# Patient Record
Sex: Female | Born: 1990 | Race: Black or African American | Hispanic: No | Smoking: Former smoker
Health system: Southern US, Community
[De-identification: ages and names within clinical notes are randomized; demographics above are authoritative.]

## PROBLEM LIST (undated history)

## (undated) DIAGNOSIS — B009 Herpesviral infection, unspecified: Secondary | ICD-10-CM

## (undated) DIAGNOSIS — Z789 Other specified health status: Secondary | ICD-10-CM

## (undated) HISTORY — PX: NO PAST SURGERIES: SHX2092

## (undated) HISTORY — DX: Other specified health status: Z78.9

## (undated) HISTORY — DX: Herpesviral infection, unspecified: B00.9

---
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
## 2013-06-17 IMAGING — US ABDOMEN ULTRASOUND
1 series · 14 of 25 positions shown · non-contrast
Comparison: none

REASON FOR EXAM: abdominal pain
COMMENTS:

PROCEDURE:     US  - US ABDOMEN GENERAL SURVEY  - July 11, 2011  [DATE]
RESULT:     Comparison: CT of the abdomen and pelvis 07/11/2011
TECHNIQUE: Multiple grayscale and color Doppler images were obtained of the
abdomen.

[Series 1: abdomen ultrasound · 0.21mm/px · 14 of 105 slices shown]
[im 1/105]
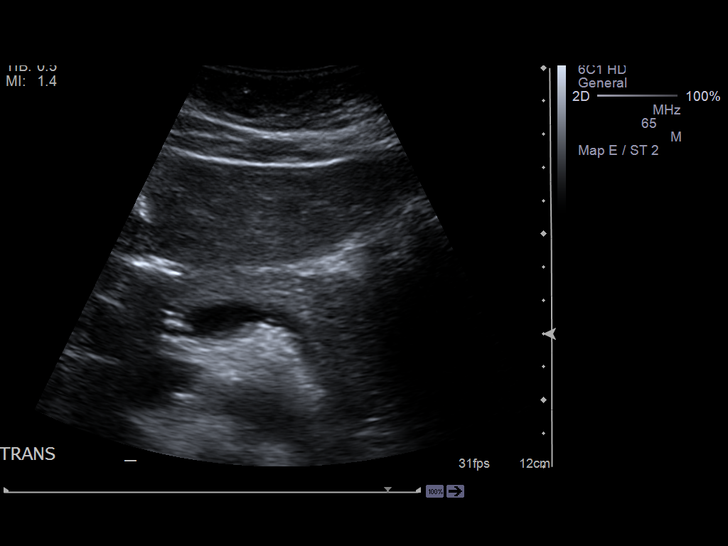
[im 9/105]
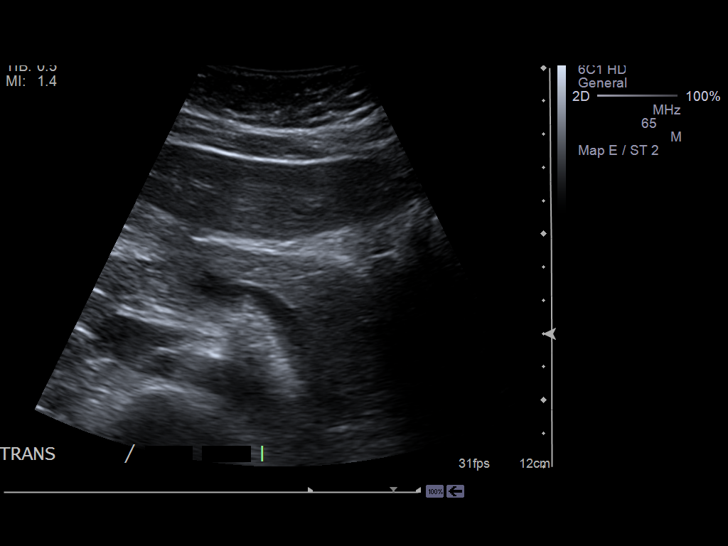
[im 18/105]
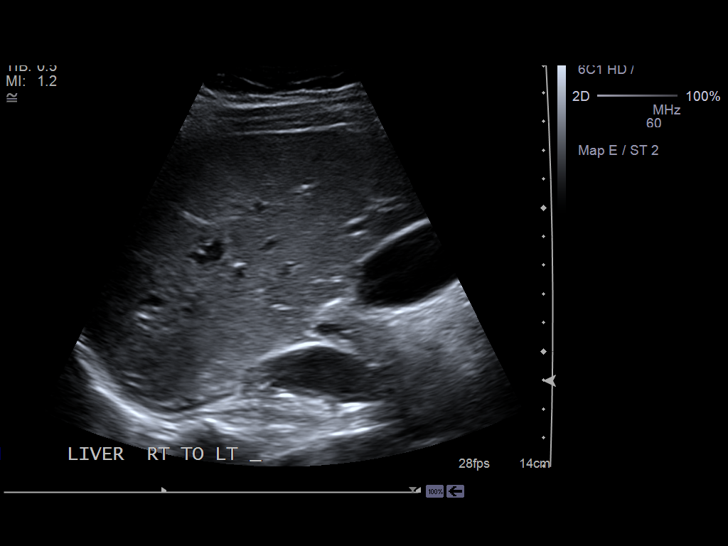
[im 27/105]
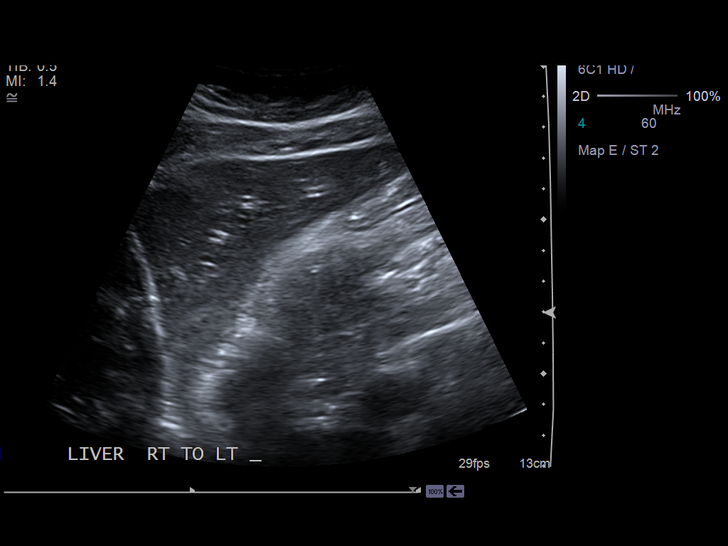
[im 35/105]
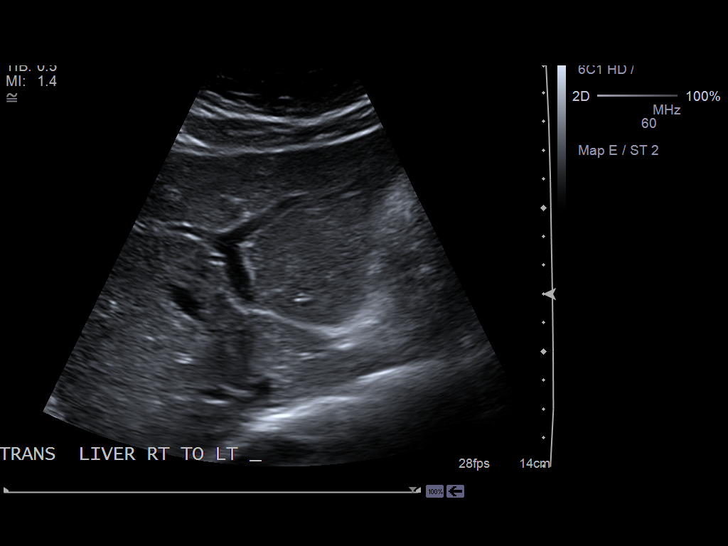
[im 40/105]
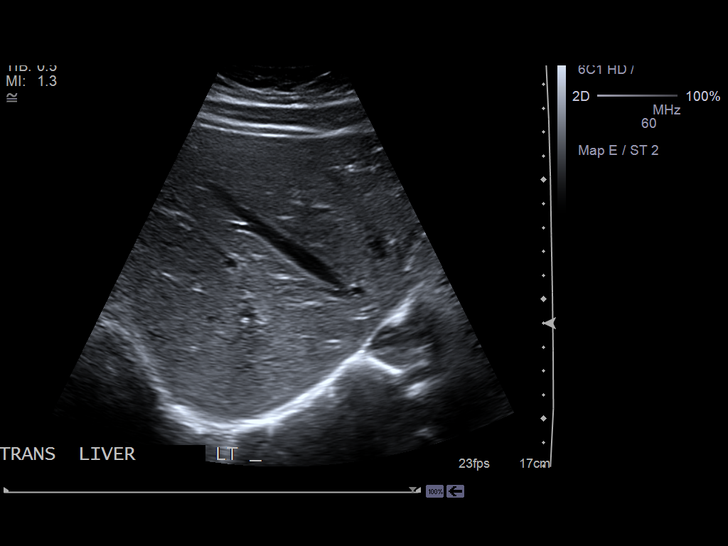
[im 48/105]
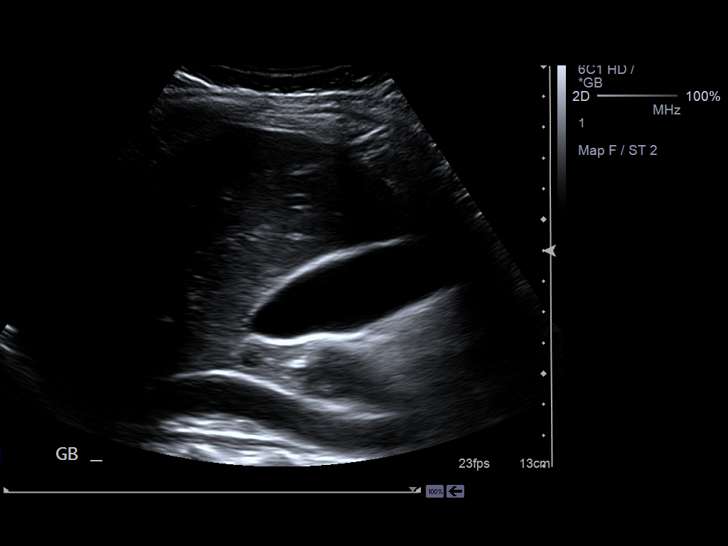
[im 57/105]
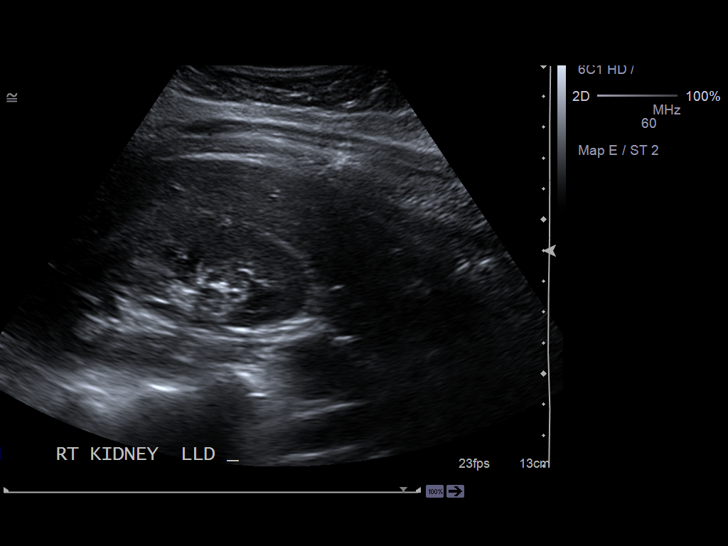
[im 66/105]
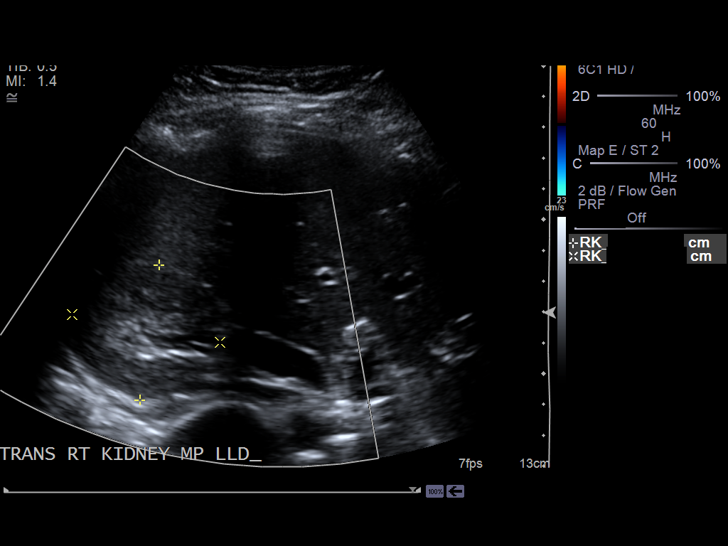
[im 70/105]
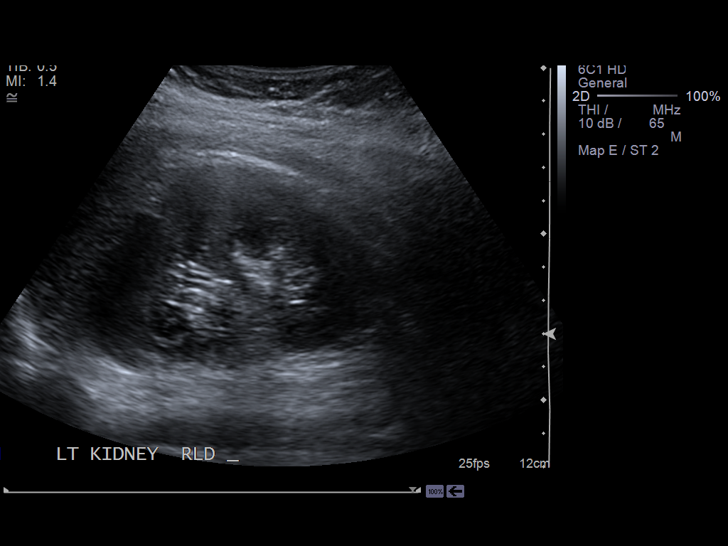
[im 79/105]
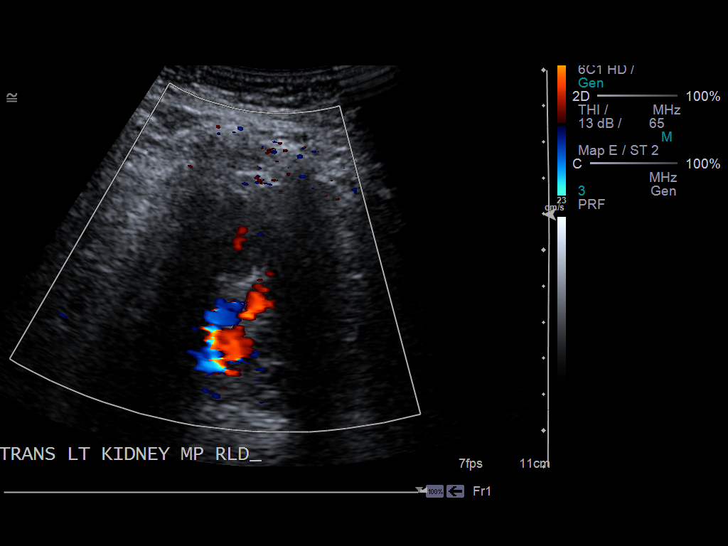
[im 87/105]
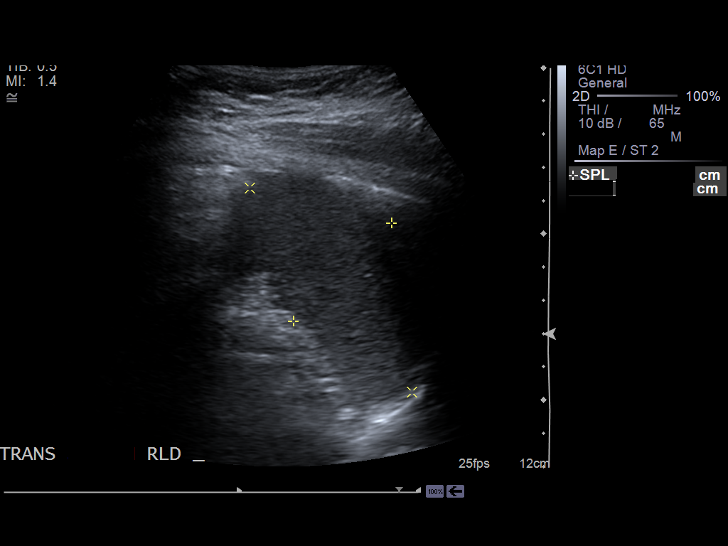
[im 96/105]
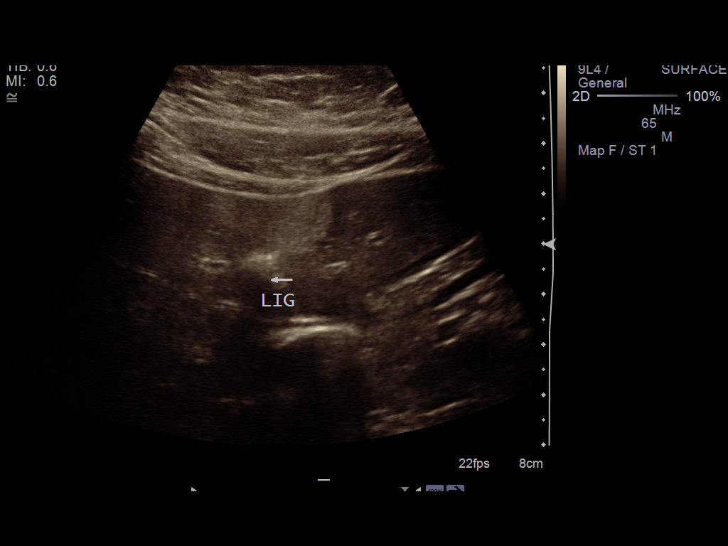
[im 105/105]
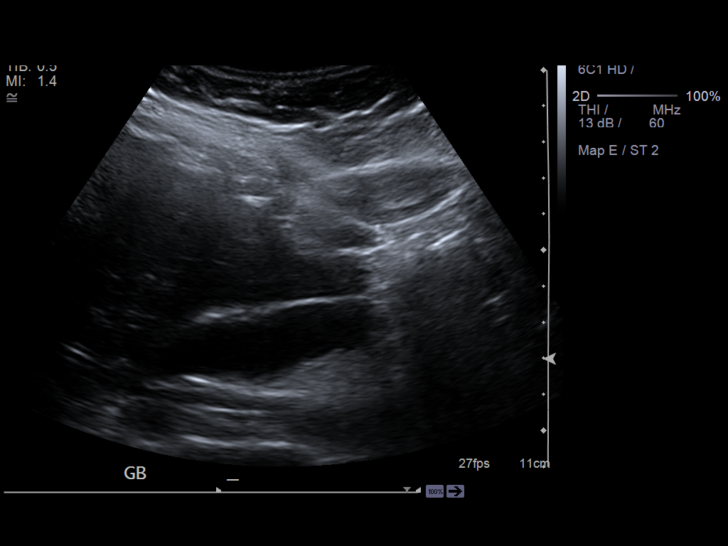

[14 of 25 positions shown; findings below may reference images not displayed]

FINDINGS: The pancreas was partially obscured by overlying bowel gas. The visualized
body of the pancreas is unremarkable. The gallbladder is normal. Sonographic
Murphy sign was negative. The common bile duct measures 2 mm. There is a
small oval shaped area of hyper echogenicity in the liver adjacent to
falciform ligament which likely represents an area of focal fatty deposition
given its appearance and location on this ultrasound as well as the recent
prior CT. This region measures approximately 2.6 x 1.8 x 1.1 cm.

The spleen is unremarkable. Images of the kidneys showed no hydronephrosis.
IMPRESSION: No acute findings. Normal gallbladder.

## 2013-06-17 IMAGING — CT CT ABD-PELV W/ CM
1 of 2 series · 16 of 32 positions shown, 20 images · non-contrast
Comparison: none

REASON FOR EXAM: (1) epigastric/RLQ pain; (2) epigastric/RLQ pain
COMMENTS:

PROCEDURE:     CT  - CT ABDOMEN / PELVIS  W  - July 11, 2011  [DATE]
RESULT:
HISTORY: Epigastric pain.

[Series 2: 3mm soft tissue · axial · 0.64mm/px · z∈[-408,-30]mm · 16 of 138 slices shown, 20 images]
[im 6/138  soft-tissue]
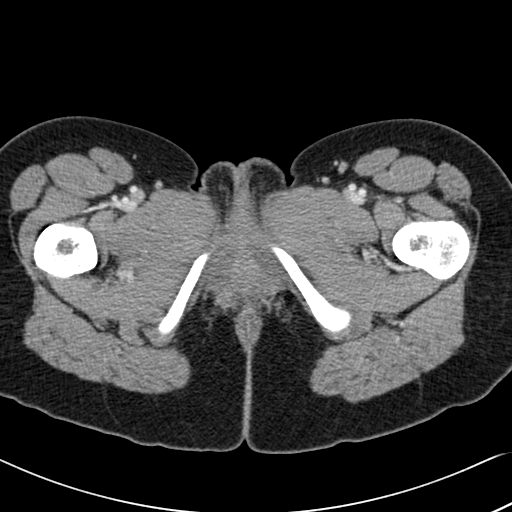
[im 6/138  bone]
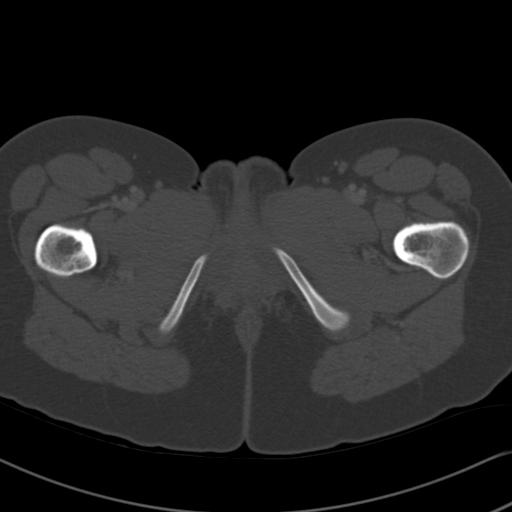
[im 17/138  soft-tissue]
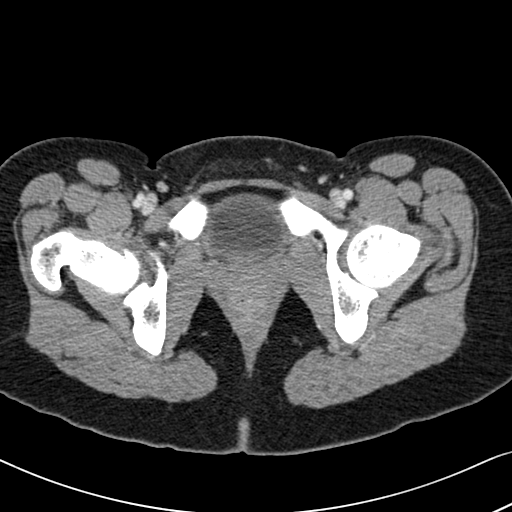
[im 28/138  soft-tissue]
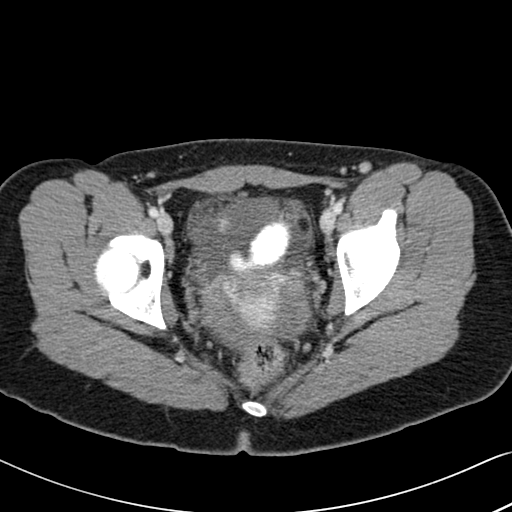
[im 39/138  soft-tissue]
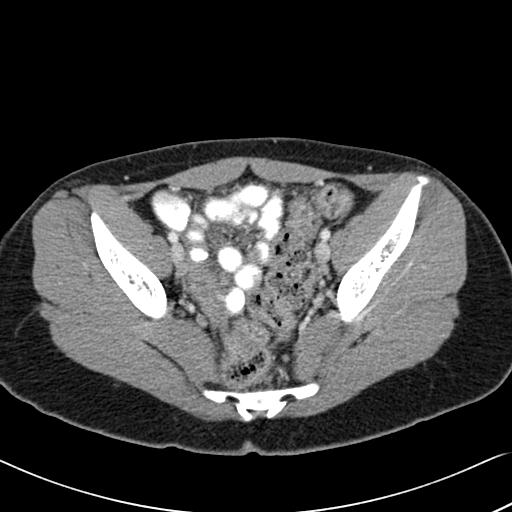
[im 44/138  soft-tissue]
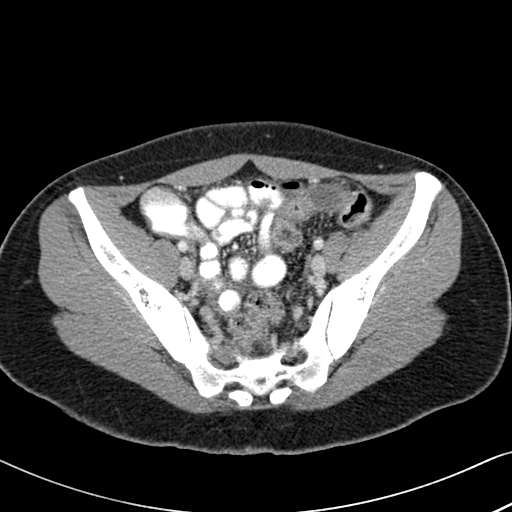
[im 55/138  soft-tissue]
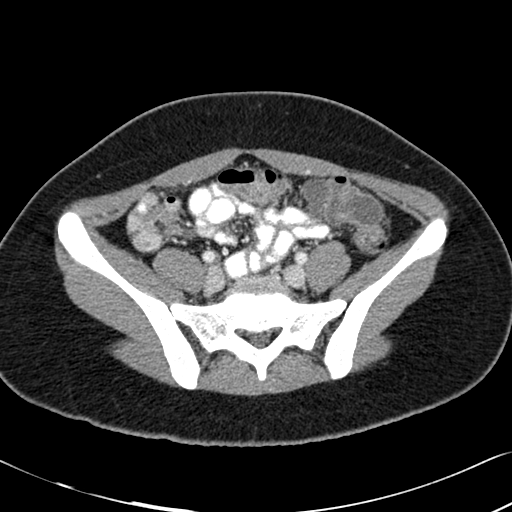
[im 66/138  soft-tissue]
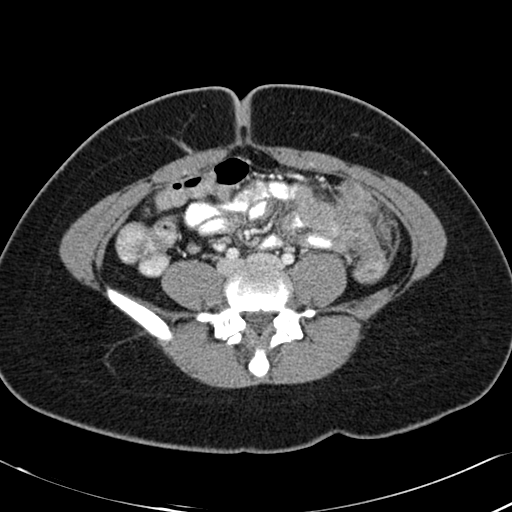
[im 72/138  soft-tissue]
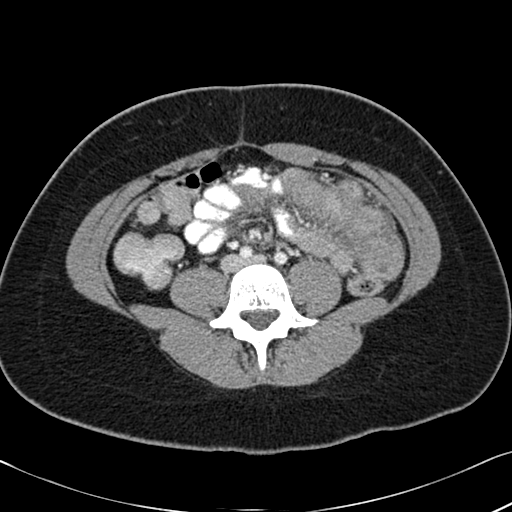
[im 83/138  soft-tissue]
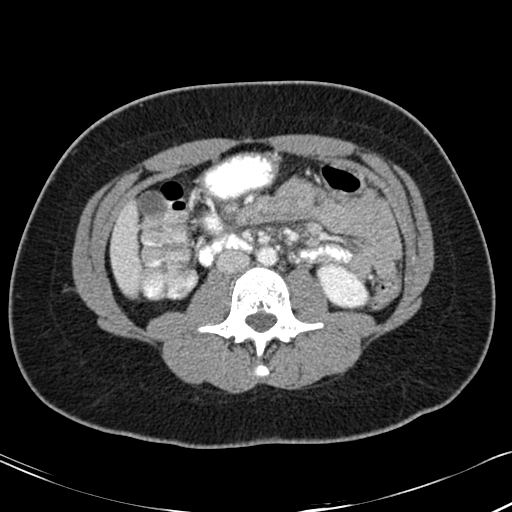
[im 83/138  bone]
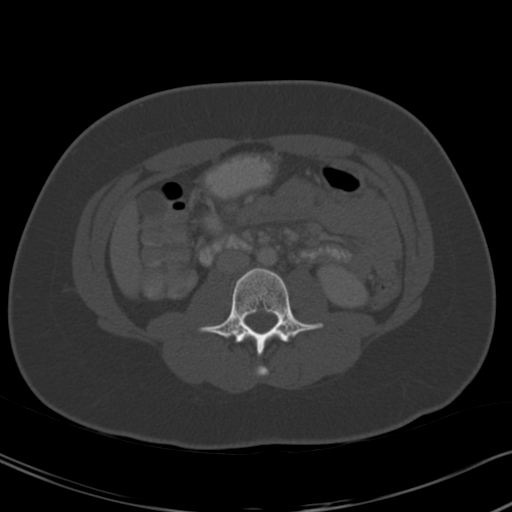
[im 94/138  soft-tissue]
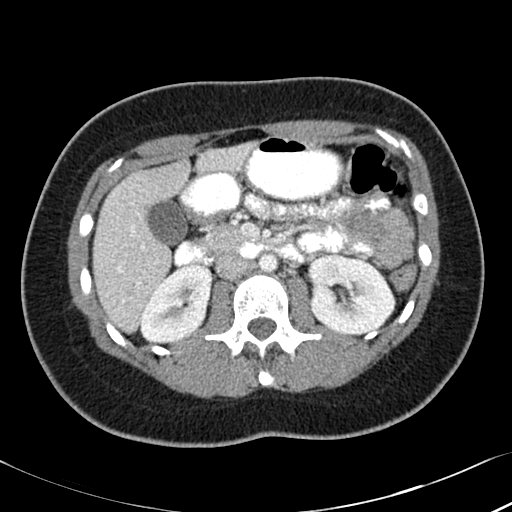
[im 105/138  soft-tissue]
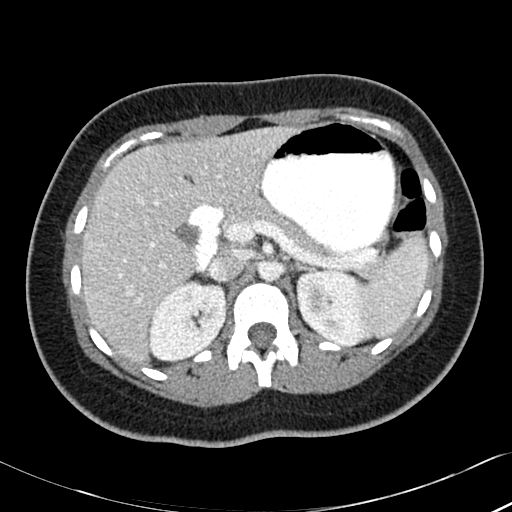
[im 110/138  soft-tissue]
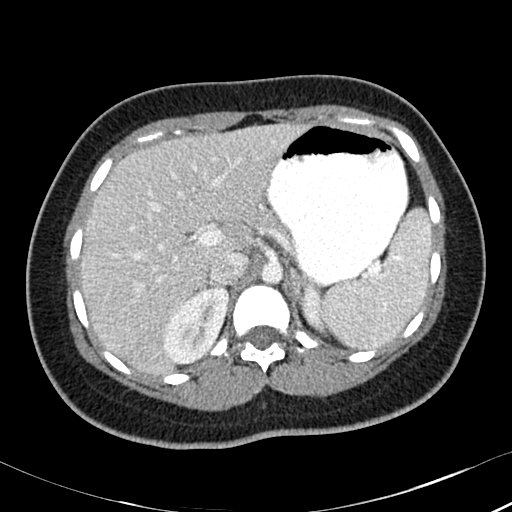
[im 116/138  lung]
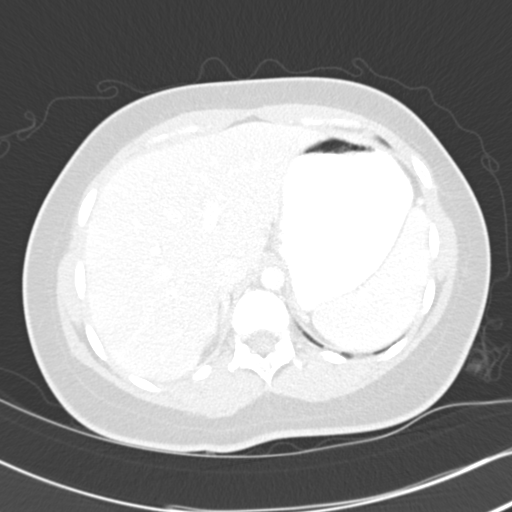
[im 121/138  soft-tissue]
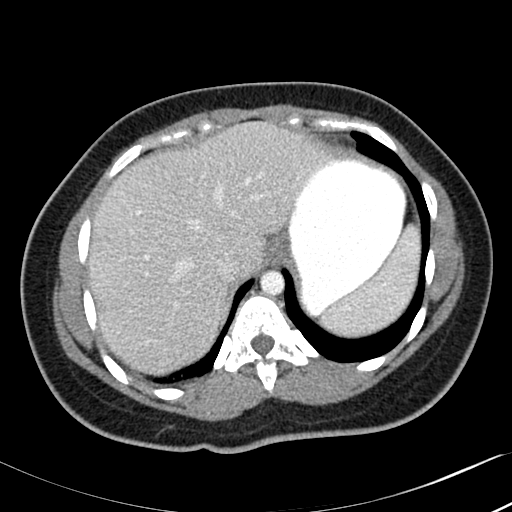
[im 121/138  lung]
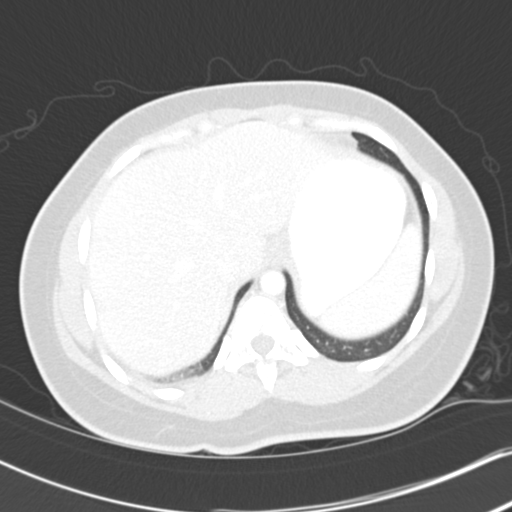
[im 127/138  lung]
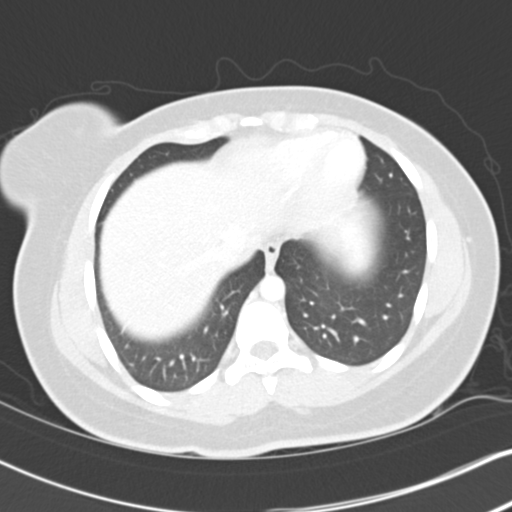
[im 132/138  soft-tissue]
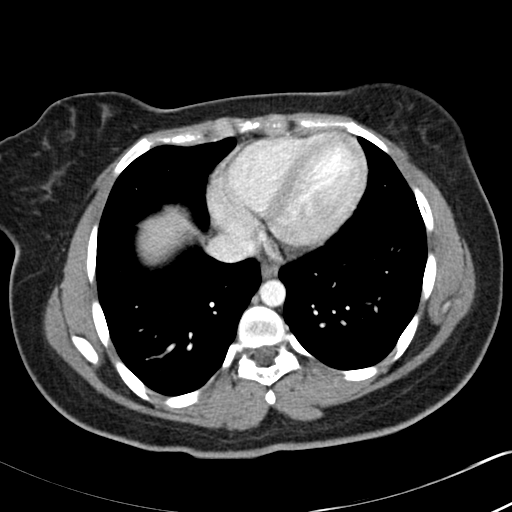
[im 132/138  lung]
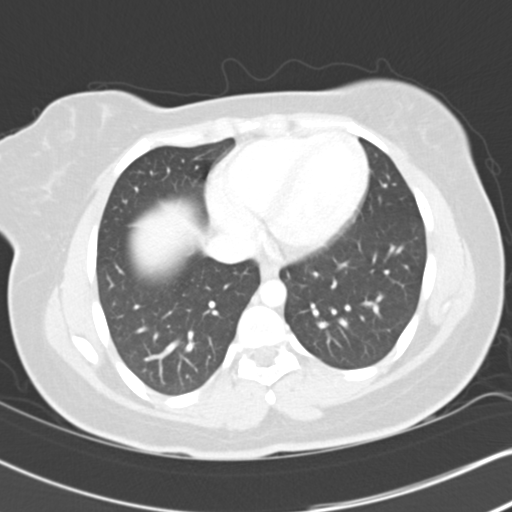

[16 of 32 positions shown; findings below may reference images not displayed]

Comparison Study: No prior.

PROCEDURE AND FINDINGS:   Standard CT obtained with 100 ml of [id number].
Lucency is noted in the region of the falciform ligament and most likely a
focal area of fatty infiltration. MR of the liver may prove useful for
further evaluation. Pancreas, spleen, adrenals, and kidneys are normal. The
bladder is nondistended. There is no bowel distention. Aorta is
nondistended. Mild bilateral adnexal fullness is noted. There is a small
amount of fluid in the cul-de-sac. Appendix is not definitely identified.
IMPRESSION: Bilateral adnexal/ovarian prominence with a small amount of
free pelvic fluid. Pelvic Ultrasound may prove useful for further evaluation.

## 2014-01-14 NOTE — L&D Delivery Note (Signed)
Patient is 25 y.o. G1P0000 [redacted]w[redacted]d admitted for PROM @ 1500 on 9/14. Patient had some elevated BPs yesterday but preeclampsia labs were negative. Patient was given cytotec x 1 and foley bulb was place. Foley bulb fell out about midnight and she had started contracting on her own. Patient got epidural and baby started having some deep variables and a few late decels. Patient was given IVF bolus, position change, and phenylpherine. Patient was given a break from cytotec and ultimately gave terbutaline to allow fetus to recover which it did. Started Pitocin at 0500 along with IUPC that was placed. FHT started showing some deep variables again and scalp electrode was placed. Patient continued to make good change and was complete at 0826.    Delivery Note At 8:59 AM a viable female was delivered via Vaginal, Spontaneous Delivery (Presentation: Right Occiput Anterior).  APGAR:9, 9; weight pending.   Placenta status: Intact, Spontaneous.  Cord: 3 vessels with the following complications: None.   Anesthesia: Epidural  Episiotomy: None Lacerations: None; had bilateral labial abrasions. Suture Repair: none Est. Blood Loss (mL): 50  Mom to postpartum.  Baby to Couplet care / Skin to Skin.  Durenda Hurt 09/29/2014, 9:20 AM  OB fellow attestation: Patient is a G1P0000 at 107w6d who was admitted for PROM. She progressed with augmentation via pitocin and made rapid progress after 5cm to complete with compensatory appearing changes in FHR. There were deep variables with pushing but goo variability throughout and return to baseline.   I was gloved and present for delivery in its entirety.  Second stage of labor progressed, baby delivered after 4 contractions.  Variable decels during second stage noted.  Complications: none  Lacerations: bilateral labial abrasions, not repaired  EBL: 50  Federico Flake, MD 9:37 AM

## 2014-04-02 ENCOUNTER — Encounter (HOSPITAL_COMMUNITY): Payer: Self-pay | Admitting: Emergency Medicine

## 2014-04-02 ENCOUNTER — Emergency Department (HOSPITAL_COMMUNITY)
Admission: EM | Admit: 2014-04-02 | Discharge: 2014-04-02 | Disposition: A | Discharge status: Home / Self Care | Payer: Medicaid Other | Attending: Emergency Medicine | Admitting: Emergency Medicine

## 2014-04-02 DIAGNOSIS — O9989 Other specified diseases and conditions complicating pregnancy, childbirth and the puerperium: Secondary | ICD-10-CM | POA: Diagnosis not present

## 2014-04-02 DIAGNOSIS — R51 Headache: Secondary | ICD-10-CM | POA: Insufficient documentation

## 2014-04-02 DIAGNOSIS — Z3A13 13 weeks gestation of pregnancy: Secondary | ICD-10-CM | POA: Insufficient documentation

## 2014-04-02 DIAGNOSIS — R519 Headache, unspecified: Secondary | ICD-10-CM

## 2014-04-02 DIAGNOSIS — O26891 Other specified pregnancy related conditions, first trimester: Secondary | ICD-10-CM

## 2014-04-02 LAB — URINALYSIS, ROUTINE W REFLEX MICROSCOPIC
Bilirubin Urine: NEGATIVE
Glucose, UA: NEGATIVE mg/dL
HGB URINE DIPSTICK: NEGATIVE
Leukocytes, UA: NEGATIVE
NITRITE: NEGATIVE
Protein, ur: NEGATIVE mg/dL
SPECIFIC GRAVITY, URINE: 1.02 (ref 1.005–1.030)
Urobilinogen, UA: 0.2 mg/dL (ref 0.0–1.0)
pH: 8 (ref 5.0–8.0)

## 2014-04-02 MED ORDER — ACETAMINOPHEN 325 MG PO TABS
650.0000 mg | ORAL_TABLET | Freq: Once | ORAL | Status: AC
  Administered 2014-04-02: 650 mg via ORAL
  Filled 2014-04-02: qty 2

## 2014-04-02 NOTE — Discharge Instructions (Signed)
Your headaches may be due to you having stopped the caffeine all together. You may have to have a limited amount to avoid headaches. Your exam today is normal.  Call [facility] to start your prenatal care. Return here as needed.

## 2014-04-02 NOTE — ED Notes (Signed)
Patient with c/o headaches since pregnancy started. Patient states she is approximately [redacted] weeks pregnant. [place] Denies n/v. Denies abdominal pain, denies vaginal discharge. LMP unknown but patient states around 12/14/13. First pregnancy.

## 2014-04-02 NOTE — ED Provider Notes (Signed)
CSN: [id number]     Arrival date & time 04/02/14  1113 History   First MD Initiated Contact with Patient 04/02/14 1120     Chief Complaint  Patient presents with  . Headache     (Consider location/radiation/quality/duration/timing/severity/associated sxs/prior Treatment) Patient is a 24 y.o. female presenting with headaches. The history is provided by the patient.  Headache Pain location:  Frontal Radiates to:  Does not radiate Severity currently:  7/10 Severity at highest:  7/10 Onset quality:  Gradual Duration:  2 days Timing:  Intermittent Chronicity:  New Similar to prior headaches: yes   Worsened by:  Light Ineffective treatments:  Acetaminophen Associated symptoms: photophobia   Associated symptoms: no abdominal pain, no back pain, no congestion, no cough, no dizziness, no ear pain, no eye pain, no fever, no myalgias, no nausea, no neck pain, no neck stiffness, no sinus pressure, no sore throat and no vomiting    Tina Sanchez is a 24 y.o. female at approximately [redacted] weeks gestation who presents to the ED with a headache. She states that she has had headaches off and on since she found out she was pregnant. She has taken tylenol when they come and sometimes they go away without any treatment other than sleep. She has not started prenatal care. Patient does report that she was drinking a lot of caffeine prior to finding out she was pregnant and after she found out she stopped all caffeine.    History reviewed. No pertinent past medical history. History reviewed. No pertinent past surgical history. No family history on file. History  Substance Use Topics  . Smoking status: Never Smoker   . Smokeless tobacco: Not on file  . Alcohol Use: Yes     Comment: occasionally   OB History    Gravida Para Term Preterm AB TAB SAB Ectopic Multiple Living   1              Review of Systems  Constitutional: Negative for fever and chills.  HENT: Negative for congestion, ear pain,  sinus pressure and sore throat.   Eyes: Positive for photophobia. Negative for pain and visual disturbance.  Respiratory: Negative for cough and wheezing.   Gastrointestinal: Negative for nausea, vomiting, abdominal pain and constipation.  Genitourinary: Negative for dysuria, urgency, frequency, vaginal bleeding and vaginal discharge.  Musculoskeletal: Negative for myalgias, back pain, neck pain and neck stiffness.  Skin: Negative for rash and wound.  Neurological: Positive for headaches. Negative for dizziness and syncope.  Psychiatric/Behavioral: Negative for confusion. The patient is not nervous/anxious.       Allergies  Review of patient's allergies indicates no known allergies.  Home Medications   Prior to Admission medications   Medication Sig Start Date End Date Taking? Authorizing Provider  acetaminophen (TYLENOL) 500 MG tablet Take 1,000 mg by mouth every 6 (six) hours as needed for mild pain.   Yes Historical Provider, MD  Prenatal Vit-Fe Fumarate-FA (PRENATAL MULTIVITAMIN) TABS tablet Take 1 tablet by mouth daily at 12 noon.   Yes Historical Provider, MD   BP 122/78 mmHg  Pulse 89  Temp(Src) 98.2 F (36.8 C) (Oral)  Resp 16  Ht  (1.651 m)  Wt 165 lb 9.6 oz (75.116 kg)  BMI 27.56 kg/m2  SpO2 100% Physical Exam  Constitutional: She is oriented to person, place, and time. She appears well-developed and well-nourished. No distress.  HENT:  Head: Normocephalic and atraumatic.  Right Ear: Tympanic membrane normal.  Left Ear: Tympanic membrane normal.  Nose: Nose normal. Right sinus exhibits no maxillary sinus tenderness and no frontal sinus tenderness. Left sinus exhibits no maxillary sinus tenderness and no frontal sinus tenderness.  Mouth/Throat: Uvula is midline, oropharynx is clear and moist and mucous membranes are normal.  Eyes: Conjunctivae and EOM are normal.  Neck: Normal range of motion. Neck supple.  Cardiovascular: Normal rate and regular rhythm.    Pulmonary/Chest: Effort normal. She has no wheezes. She has no rales.  Abdominal: Soft. Bowel sounds are normal. She exhibits no mass. There is no tenderness.  Musculoskeletal: She exhibits no edema.  Radial and pedal pulses strong, adequate circulation, good touch sensation.  Neurological: She is alert and oriented to person, place, and time. She has normal strength. No cranial nerve deficit or sensory deficit. She displays a negative Romberg sign. Gait normal.  Reflex Scores:      Bicep reflexes are 2+ on the right side and 2+ on the left side.      Brachioradialis reflexes are 2+ on the right side and 2+ on the left side.      Patellar reflexes are 2+ on the right side and 2+ on the left side.      Achilles reflexes are 2+ on the right side and 2+ on the left side. Rapid alternating movement without difficulty. Stands on one foot without difficulty.  Psychiatric: She has a normal mood and affect. Her behavior is normal.    ED Course  Procedures (including critical care time) Results for orders placed or performed during the hospital encounter of 04/02/14 (from the past 24 hour(s))  Urinalysis, Routine w reflex microscopic     Status: Abnormal   Collection Time: 04/02/14 11:30 AM  Result Value Ref Range   Color, Urine YELLOW YELLOW   APPearance CLEAR CLEAR   Specific Gravity, Urine 1.020 1.005 - 1.030   pH 8.0 5.0 - 8.0   Glucose, UA NEGATIVE NEGATIVE mg/dL   Hgb urine dipstick NEGATIVE NEGATIVE   Bilirubin Urine NEGATIVE NEGATIVE   Ketones, ur TRACE (A) NEGATIVE mg/dL   Protein, ur NEGATIVE NEGATIVE mg/dL   Urobilinogen, UA 0.2 0.0 - 1.0 mg/dL   Nitrite NEGATIVE NEGATIVE   Leukocytes, UA NEGATIVE NEGATIVE     MDM  24 y.o. female @ approximately [redacted] weeks gestation with headache. Discussed caffeine withdrawal and need for starting prenatal care. Patient stable for discharge without focal neuro deficits. Discussed with the patient plan of care and all questioned fully answered.  She will return if any problems arise.   Final diagnoses:  Headache in pregnancy, first trimester      Janne NapoleonHope M Jessee Newnam, NP 04/02/14 1307  Samuel JesterKathleen McManus, DO 04/04/14 1332

## 2014-04-02 NOTE — ED Notes (Signed)
Patient with no complaints at this time. Respirations even and unlabored. Skin warm/dry. Discharge instructions reviewed with patient at this time. Patient given opportunity to voice concerns/ask questions. Patient discharged at this time and left Emergency Department with steady gait.   

## 2014-04-15 ENCOUNTER — Other Ambulatory Visit: Payer: Self-pay | Admitting: *Deleted

## 2014-04-15 DIAGNOSIS — O3680X0 Pregnancy with inconclusive fetal viability, not applicable or unspecified: Secondary | ICD-10-CM

## 2014-04-18 ENCOUNTER — Other Ambulatory Visit: Payer: Self-pay | Admitting: Obstetrics and Gynecology

## 2014-04-18 ENCOUNTER — Ambulatory Visit (INDEPENDENT_AMBULATORY_CARE_PROVIDER_SITE_OTHER): Payer: Medicaid Other

## 2014-04-18 DIAGNOSIS — O3680X0 Pregnancy with inconclusive fetal viability, not applicable or unspecified: Secondary | ICD-10-CM

## 2014-04-18 NOTE — Progress Notes (Signed)
US 244w3d single IUP w/pos fht 144bpm,fundal pl grade 0,cx appears closed,bilat adnexa wnl

## 2014-04-25 ENCOUNTER — Ambulatory Visit (INDEPENDENT_AMBULATORY_CARE_PROVIDER_SITE_OTHER): Payer: Medicaid Other | Admitting: Women's Health

## 2014-04-25 ENCOUNTER — Other Ambulatory Visit (HOSPITAL_COMMUNITY)
Admission: RE | Admit: 2014-04-25 | Discharge: 2014-04-25 | Disposition: A | Discharge status: Home / Self Care | Payer: Medicaid Other | Source: Ambulatory Visit | Attending: Obstetrics & Gynecology | Admitting: Obstetrics & Gynecology

## 2014-04-25 ENCOUNTER — Encounter: Payer: Self-pay | Admitting: Women's Health

## 2014-04-25 VITALS — BP 108/60 | HR 92 | Wt 163.0 lb

## 2014-04-25 DIAGNOSIS — Z3682 Encounter for antenatal screening for nuchal translucency: Secondary | ICD-10-CM

## 2014-04-25 DIAGNOSIS — Z369 Encounter for antenatal screening, unspecified: Secondary | ICD-10-CM

## 2014-04-25 DIAGNOSIS — Z1389 Encounter for screening for other disorder: Secondary | ICD-10-CM

## 2014-04-25 DIAGNOSIS — Z01419 Encounter for gynecological examination (general) (routine) without abnormal findings: Secondary | ICD-10-CM | POA: Diagnosis present

## 2014-04-25 DIAGNOSIS — Z124 Encounter for screening for malignant neoplasm of cervix: Secondary | ICD-10-CM

## 2014-04-25 DIAGNOSIS — Z363 Encounter for antenatal screening for malformations: Secondary | ICD-10-CM

## 2014-04-25 DIAGNOSIS — Z113 Encounter for screening for infections with a predominantly sexual mode of transmission: Secondary | ICD-10-CM | POA: Diagnosis present

## 2014-04-25 DIAGNOSIS — Z3402 Encounter for supervision of normal first pregnancy, second trimester: Secondary | ICD-10-CM

## 2014-04-25 DIAGNOSIS — Z331 Pregnant state, incidental: Secondary | ICD-10-CM

## 2014-04-25 DIAGNOSIS — Z0283 Encounter for blood-alcohol and blood-drug test: Secondary | ICD-10-CM

## 2014-04-25 DIAGNOSIS — Z3400 Encounter for supervision of normal first pregnancy, unspecified trimester: Secondary | ICD-10-CM | POA: Insufficient documentation

## 2014-04-25 LAB — POCT URINALYSIS DIPSTICK
Blood, UA: NEGATIVE
GLUCOSE UA: NEGATIVE
Ketones, UA: NEGATIVE
Leukocytes, UA: NEGATIVE
Nitrite, UA: NEGATIVE
Protein, UA: NEGATIVE

## 2014-04-25 LAB — OB RESULTS CONSOLE RUBELLA ANTIBODY, IGM: RUBELLA: IMMUNE

## 2014-04-25 LAB — OB RESULTS CONSOLE ABO/RH: RH TYPE: POSITIVE

## 2014-04-25 LAB — OB RESULTS CONSOLE RPR: RPR: NONREACTIVE

## 2014-04-25 LAB — OB RESULTS CONSOLE HIV ANTIBODY (ROUTINE TESTING): HIV: NONREACTIVE

## 2014-04-25 LAB — OB RESULTS CONSOLE VARICELLA ZOSTER ANTIBODY, IGG: Varicella: NON-IMMUNE/NOT IMMUNE

## 2014-04-25 NOTE — Progress Notes (Signed)
  Subjective:  Tina Sanchez is a 24 y.o. G1P0 African American female at 1936w3d by 15wk u/s, being seen today for her first obstetrical visit.  Her obstetrical history is significant for previous smoker- quit w/ +PT, primigravida. States bp's have been labile in past, no dx of htn. Pregnancy history fully reviewed.  Patient reports no complaints. Denies vb, cramping, uti s/s, abnormal/malodorous vag d/c, or vulvovaginal itching/irritation.  BP 108/60 mmHg  Pulse 92  Wt 163 lb (73.936 kg)  LMP  (LMP Unknown)  HISTORY: OB History  Gravida Para Term Preterm AB SAB TAB Ectopic Multiple Living  1             # Outcome Date GA Lbr Len/2nd Weight Sex Delivery Anes PTL Lv  1 Current              Past Medical History  Diagnosis Date  . Medical history non-contributory    Past Surgical History  Procedure Laterality Date  . No past surgeries     Family History  Problem Relation Age of Onset  . Cancer Paternal Uncle     prostate  . Hypertension Maternal Grandmother   . Cancer Maternal Grandfather     brain  . Diabetes Paternal Grandfather     Exam   System:     General: Well developed & nourished, no acute distress   Skin: Warm & dry, normal coloration and turgor, no rashes   Neurologic: Alert & oriented, normal mood   Cardiovascular: Regular rate & rhythm   Respiratory: Effort & rate normal, LCTAB, acyanotic   Abdomen: Soft, non tender   Extremities: normal strength, tone   Pelvic Exam:    Perineum: Normal perineum   Vulva: Normal, no lesions   Vagina:  Normal mucosa, normal discharge   Cervix: Normal, bulbous, appears closed   Uterus: Normal size/shape/contour for GA   Thin prep pap smear obtained w/ reflex high risk HPV cotesting FHR: 155 via doppler   Assessment:   Pregnancy: G1P0 Patient Active Problem List   Diagnosis Date Noted  . Supervision of normal first pregnancy 04/25/2014    Priority: High    10936w3d G1P0 New OB visit Previous smoker,  quit  Plan:  Initial labs drawn Continue prenatal vitamins Problem list reviewed and updated Reviewed n/v relief measures and warning s/s to report Reviewed recommended weight gain based on pre-gravid BMI Encouraged well-balanced diet Genetic Screening discussed Quad Screen: requested & ordered Cystic fibrosis screening discussed requested Ultrasound discussed; fetal survey: requested Follow up in 4 weeks for visit and anatomy u/s CCNC completed [place] resident, unable to offer NFP  Marge DuncansBooker, Kimberly Randall CNM, [facility] 04/25/2014 2:36 PM

## 2014-04-25 NOTE — Progress Notes (Signed)
1 

## 2014-04-25 NOTE — Patient Instructions (Signed)
Second Trimester of Pregnancy The second trimester is from week 13 through week 28, months 4 through 6. The second trimester is often a time when you feel your best. Your body has also adjusted to being pregnant, and you begin to feel better physically. Usually, morning sickness has lessened or quit completely, you may have more energy, and you may have an increase in appetite. The second trimester is also a time when the fetus is growing rapidly. At the end of the sixth month, the fetus is about 9 inches long and weighs about 1 pounds. You will likely begin to feel the baby move (quickening) between 18 and 20 weeks of the pregnancy. BODY CHANGES Your body goes through many changes during pregnancy. The changes vary from woman to woman.   Your weight will continue to increase. You will notice your lower abdomen bulging out.  You may begin to get stretch marks on your hips, abdomen, and breasts.  You may develop headaches that can be relieved by medicines approved by your health care provider.  You may urinate more often because the fetus is pressing on your bladder.  You may develop or continue to have heartburn as a result of your pregnancy.  You may develop constipation because certain hormones are causing the muscles that push waste through your intestines to slow down.  You may develop hemorrhoids or swollen, bulging veins (varicose veins).  You may have back pain because of the weight gain and pregnancy hormones relaxing your joints between the bones in your pelvis and as a result of a shift in weight and the muscles that support your balance.  Your breasts will continue to grow and be tender.  Your gums may bleed and may be sensitive to brushing and flossing.  Dark spots or blotches (chloasma, mask of pregnancy) may develop on your face. This will likely fade after the baby is born.  A dark line from your belly button to the pubic area (linea nigra) may appear. This will likely fade  after the baby is born.  You may have changes in your hair. These can include thickening of your hair, rapid growth, and changes in texture. Some women also have hair loss during or after pregnancy, or hair that feels dry or thin. Your hair will most likely return to normal after your baby is born. WHAT TO EXPECT AT YOUR PRENATAL VISITS During a routine prenatal visit:  You will be weighed to make sure you and the fetus are growing normally.  Your blood pressure will be taken.  Your abdomen will be measured to track your baby's growth.  The fetal heartbeat will be listened to.  Any test results from the previous visit will be discussed. Your health care provider may ask you:  How you are feeling.  If you are feeling the baby move.  If you have had any abnormal symptoms, such as leaking fluid, bleeding, severe headaches, or abdominal cramping.  If you have any questions. Other tests that may be performed during your second trimester include:  Blood tests that check for:  Low iron levels (anemia).  Gestational diabetes (between 24 and 28 weeks).  Rh antibodies.  Urine tests to check for infections, diabetes, or protein in the urine.  An ultrasound to confirm the proper growth and development of the baby.  An amniocentesis to check for possible genetic problems.  Fetal screens for spina bifida and Down syndrome. HOME CARE INSTRUCTIONS   Avoid all smoking, herbs, alcohol, and unprescribed   drugs. These chemicals affect the formation and growth of the baby.  Follow your health care provider's instructions regarding medicine use. There are medicines that are either safe or unsafe to take during pregnancy.  Exercise only as directed by your health care provider. Experiencing uterine cramps is a good sign to stop exercising.  Continue to eat regular, healthy meals.  Wear a good support bra for breast tenderness.  Do not use hot tubs, steam rooms, or saunas.  Wear your  seat belt at all times when driving.  Avoid raw meat, uncooked cheese, cat litter boxes, and soil used by cats. These carry germs that can cause birth defects in the baby.  Take your prenatal vitamins.  Try taking a stool softener (if your health care provider approves) if you develop constipation. Eat more high-fiber foods, such as fresh vegetables or fruit and whole grains. Drink plenty of fluids to keep your urine clear or pale yellow.  Take warm sitz baths to soothe any pain or discomfort caused by hemorrhoids. Use hemorrhoid cream if your health care provider approves.  If you develop varicose veins, wear support hose. Elevate your feet for 15 minutes, 3-4 times a day. Limit salt in your diet.  Avoid heavy lifting, wear low heel shoes, and practice good posture.  Rest with your legs elevated if you have leg cramps or low back pain.  Visit your dentist if you have not gone yet during your pregnancy. Use a soft toothbrush to brush your teeth and be gentle when you floss.  A sexual relationship may be continued unless your health care provider directs you otherwise.  Continue to go to all your prenatal visits as directed by your health care provider. SEEK MEDICAL CARE IF:   You have dizziness.  You have mild pelvic cramps, pelvic pressure, or nagging pain in the abdominal area.  You have persistent nausea, vomiting, or diarrhea.  You have a bad smelling vaginal discharge.  You have pain with urination. SEEK IMMEDIATE MEDICAL CARE IF:   You have a fever.  You are leaking fluid from your vagina.  You have spotting or bleeding from your vagina.  You have severe abdominal cramping or pain.  You have rapid weight gain or loss.  You have shortness of breath with chest pain.  You notice sudden or extreme swelling of your face, hands, ankles, feet, or legs.  You have not felt your baby move in over an hour.  You have severe headaches that do not go away with  medicine.  You have vision changes. Document Released: 12/25/2000 Document Revised: 01/05/2013 Document Reviewed: 03/03/2012 [employer] Patient Information 2015 [employer]. This information is not intended to replace advice given to you by your health care provider. Make sure you discuss any questions you have with your health care provider.  

## 2014-04-26 ENCOUNTER — Encounter: Payer: Self-pay | Admitting: Women's Health

## 2014-04-26 DIAGNOSIS — Z2839 Other underimmunization status: Secondary | ICD-10-CM | POA: Insufficient documentation

## 2014-04-26 DIAGNOSIS — F1290 Cannabis use, unspecified, uncomplicated: Secondary | ICD-10-CM | POA: Insufficient documentation

## 2014-04-26 DIAGNOSIS — Z283 Underimmunization status: Secondary | ICD-10-CM

## 2014-04-26 DIAGNOSIS — O09899 Supervision of other high risk pregnancies, unspecified trimester: Secondary | ICD-10-CM | POA: Insufficient documentation

## 2014-04-27 ENCOUNTER — Telehealth: Payer: Self-pay | Admitting: Women's Health

## 2014-04-27 DIAGNOSIS — O23592 Infection of other part of genital tract in pregnancy, second trimester: Principal | ICD-10-CM

## 2014-04-27 DIAGNOSIS — A5901 Trichomonal vulvovaginitis: Secondary | ICD-10-CM

## 2014-04-27 DIAGNOSIS — A749 Chlamydial infection, unspecified: Secondary | ICD-10-CM

## 2014-04-27 DIAGNOSIS — Z8619 Personal history of other infectious and parasitic diseases: Secondary | ICD-10-CM | POA: Insufficient documentation

## 2014-04-27 DIAGNOSIS — O98812 Other maternal infectious and parasitic diseases complicating pregnancy, second trimester: Secondary | ICD-10-CM

## 2014-04-27 LAB — AFP, QUAD SCREEN
DIA Mom Value: 0.94
DIA VALUE (EIA): 163.01 pg/mL
DSR (By Age)    1 IN: 1119
DSR (Second Trimester) 1 IN: 3390
Gestational Age: 16.4 WEEKS
MATERNAL AGE AT EDD: 22.4 a
MSAFP MOM: 0.55
MSAFP: 20.4 ng/mL
MSHCG MOM: 0.84
MSHCG: 28755 m[IU]/mL
OSB RISK: 10000
T18 (By Age): 1:4359 {titer}
TEST RESULTS AFP: NEGATIVE
Weight: 163 [lb_av]
uE3 Mom: 0.77
uE3 Value: 0.71 ng/mL

## 2014-04-27 LAB — URINE CULTURE: Organism ID, Bacteria: NO GROWTH

## 2014-04-27 LAB — CYTOLOGY - PAP

## 2014-04-27 MED ORDER — METRONIDAZOLE 500 MG PO TABS: 2000.0000 mg | ORAL_TABLET | Freq: Once | ORAL | Status: DC

## 2014-04-27 MED ORDER — AZITHROMYCIN 500 MG PO TABS: 1000.0000 mg | ORAL_TABLET | Freq: Once | ORAL | Status: DC

## 2014-04-27 NOTE — Telephone Encounter (Signed)
Calling to notify pt of +CT & trich and need for tx for her & partner. VM box has not been set up, unable to leave message, will continue trying to contact and if unable will send certified letter.  Cheral MarkerKimberly R. Dominic Mahaney, CNM, [facility]NP[facility]BC 04/27/2014 2:43 PM

## 2014-04-28 ENCOUNTER — Telehealth: Payer: Self-pay | Admitting: *Deleted

## 2014-04-28 NOTE — Telephone Encounter (Signed)
-----   Message from Cheral MarkerKimberly R Booker, [place] sent at 04/27/2014  5:08 PM EDT ----- Regarding: Please call pt I have tried to contact her w/o success. She has chlamydia & trichomonas, I have sent in rx's for both to her pharmacy. Her partner also needs to be treated, so if you are able to contact her please get this info and give to a provider that is here to rx for him. No sex for at least 7d from time both have taken meds. Will recheck her in 4wks to make sure gone.  Thanks!

## 2014-04-29 LAB — PMP SCREEN PROFILE (10S), URINE
Amphetamine Screen, Ur: NEGATIVE ng/mL
Barbiturate Screen, Ur: NEGATIVE ng/mL
Benzodiazepine Screen, Urine: NEGATIVE ng/mL
CANNABINOIDS UR QL SCN: POSITIVE ng/mL
Cocaine(Metab.)Screen, Urine: NEGATIVE ng/mL
Creatinine(Crt), U: 285.2 mg/dL (ref 20.0–300.0)
Methadone Scn, Ur: NEGATIVE ng/mL
Opiate Scrn, Ur: NEGATIVE ng/mL
Oxycodone+Oxymorphone Ur Ql Scn: NEGATIVE ng/mL
PCP Scrn, Ur: NEGATIVE ng/mL
Ph of Urine: 5.8 (ref 4.5–8.9)
Propoxyphene, Screen: NEGATIVE ng/mL

## 2014-04-29 LAB — SICKLE CELL SCREEN: Sickle Cell Screen: NEGATIVE

## 2014-04-29 LAB — CBC
HCT: 32.2 % — ABNORMAL LOW (ref 34.0–46.6)
Hemoglobin: 10.7 g/dL — ABNORMAL LOW (ref 11.1–15.9)
MCH: 26.6 pg (ref 26.6–33.0)
MCHC: 33.2 g/dL (ref 31.5–35.7)
MCV: 80 fL (ref 79–97)
PLATELETS: 248 10*3/uL (ref 150–379)
RBC: 4.03 x10E6/uL (ref 3.77–5.28)
RDW: 13.6 % (ref 12.3–15.4)
WBC: 8.1 10*3/uL (ref 3.4–10.8)

## 2014-04-29 LAB — ABO/RH: Rh Factor: POSITIVE

## 2014-04-29 LAB — URINALYSIS, ROUTINE W REFLEX MICROSCOPIC
BILIRUBIN UA: NEGATIVE
Glucose, UA: NEGATIVE
Nitrite, UA: NEGATIVE
RBC, UA: NEGATIVE
Specific Gravity, UA: 1.03 (ref 1.005–1.030)
UUROB: 1 mg/dL (ref 0.2–1.0)
pH, UA: 6 (ref 5.0–7.5)

## 2014-04-29 LAB — MICROSCOPIC EXAMINATION: Casts: NONE SEEN /lpf

## 2014-04-29 LAB — HEPATITIS B SURFACE ANTIGEN: HEP B S AG: NEGATIVE

## 2014-04-29 LAB — ANTIBODY SCREEN: Antibody Screen: NEGATIVE

## 2014-04-29 LAB — CYSTIC FIBROSIS MUTATION 97: Interpretation: NOT DETECTED

## 2014-04-29 LAB — VARICELLA ZOSTER ANTIBODY, IGG: Varicella zoster IgG: <135 index — ABNORMAL LOW (ref >165)

## 2014-04-29 LAB — RPR: RPR: NONREACTIVE

## 2014-04-29 LAB — RUBELLA SCREEN: Rubella Antibodies, IGG: 17.9 index (ref >0.99)

## 2014-04-29 LAB — HIV ANTIBODY (ROUTINE TESTING W REFLEX): HIV Screen 4th Generation wRfx: NONREACTIVE

## 2014-05-02 ENCOUNTER — Telehealth: Payer: Self-pay | Admitting: Women's Health

## 2014-05-02 DIAGNOSIS — O98812 Other maternal infectious and parasitic diseases complicating pregnancy, second trimester: Principal | ICD-10-CM

## 2014-05-02 DIAGNOSIS — A749 Chlamydial infection, unspecified: Secondary | ICD-10-CM

## 2014-05-02 DIAGNOSIS — A5901 Trichomonal vulvovaginitis: Secondary | ICD-10-CM

## 2014-05-02 DIAGNOSIS — O23592 Infection of other part of genital tract in pregnancy, second trimester: Secondary | ICD-10-CM

## 2014-05-02 NOTE — Telephone Encounter (Signed)
Still unable to contact pt via phone to notify of +CT & trichomonas. Will send certified letter.  Cheral MarkerKimberly R. Zaion Hreha, CNM, [facility]NP[facility]BC 05/02/2014 10:27 AM

## 2014-05-08 NOTE — H&P (Signed)
PATIENT NAME:  Tina Sanchez, Tina Sanchez MR#:  [id number] DATE OF BIRTH:  1990-10-26  DATE OF ADMISSION:  07/11/2011  PRIMARY CARE PHYSICIAN: [facility] ADMITTING PHYSICIAN: Dr. Michela Pitcher  BRIEF HISTORY: Tina Sanchez is a 24 year old girl seen in the Emergency Room with a 3 to 4 day history of abdominal pain. The pain is primarily midepigastric without radiation. She has not had any bowel or bladder habit change associated with it. She has not had any nausea or vomiting. She has been eating regularly. The pain has been intermittent with periods of minutes without pain and she appears to have a crampy component to the pain with increasing discomfort on occasion. She had a fever of 101 at home which was confirmed at the Emergency Room evaluation. She presented to the Emergency Room for further evaluation. White blood cell count was slightly elevated at 11,000. Electrolytes were otherwise unremarkable. Liver function studies were unremarkable. CT scan of the abdomen with contrast was performed which demonstrated some free fluid in the pelvis and some possible cystic changes in her ovaries. The appendix was not identified and there was no evidence of any periappendiceal stranding or fluid suggesting a possible acute appendicitis. The surgical service was consulted.   She relates that she has been having problems with left lower quadrant pain over the last several weeks and [facility] They were concerned she might have some ovarian problems but no further work-up was instituted at that time. She does have quite irregular periods. She has not been on any birth control pills. She has not had any previous GYN problems that she is aware of. She has been eating normally. She is hungry at the present time, would like to eat.   She denies any previous abdominal surgery. She denies any history of hepatitis, yellow jaundice, pancreatitis, peptic ulcer disease, yellow jaundice or diverticulitis. She has no  known cardiac disease, hypertension or diabetes. She has not been hospitalized. She does not smoke cigarettes or drink any alcohol.   MEDICATIONS: She takes no medications regularly.   ALLERGIES: Has no medical allergies.   FAMILY HISTORY: Noncontributory.   REVIEW OF SYSTEMS: 10 point review of systems is unremarkable. Specifically she denies any hearing or visual changes. She denies any shortness of breath or dyspnea on exertion. She denies any orthopnea, chest pain or rhythm disturbances. She has not had any palpitations. She has had no urinary symptoms with no urgency or frequency. She denies urinary tract infections. She has no neurologic symptoms and no psychiatric symptoms.   PHYSICAL EXAMINATION:  GENERAL: She is an alert, pleasant young woman accompanied by her parents.   VITAL SIGNS: Temperature 100.4. She is mildly tachycardic to 120. Blood pressure 106/68.   HEENT: Unremarkable. She has no scleral icterus and her pupils are equally round. There is no facial deformity.   NECK: Supple without adenopathy. Trachea is midline.   CHEST: Clear with no adventitious sounds. She has normal pulmonary excursion.   CARDIAC: No murmurs or gallops to my ear but she does have a rapid rate and would be difficult to detect that. She does appear to have a regular rhythm, although rapid.   ABDOMEN: Her abdomen is soft with no point tenderness. She does have some reaction to midepigastric palpation but overall does not complain of any significant abdominal discomfort on examination. She has no lower quadrant tenderness to exam. She has active bowel sounds. No hernias are noted. No masses are noted. She has no rebound or  guarding.   EXTREMITIES: Full range of motion, good distal pulses. No deformities.   PSYCHIATRIC: Normal orientation, normal affect.   LABORATORY, DIAGNOSTIC AND RADIOLOGICAL DATA: I have independently reviewed her CT scan. I do not see any significant abdominal findings other  than some mild pelvic fluid. Her ovaries do appear large on each side. There is no lower quadrant abdominal discomfort on examination.   ASSESSMENT AND PLAN: Working diagnosis here would be abdominal pain, etiology unknown. I doubt a viral syndrome as she has no real nausea, vomiting, or diarrhea. She does have slightly elevated temperature at 100. We offered the family inpatient versus outpatient work-up. They would prefer inpatient work-up. Will admit her to the hospital. Put her on IV rehydration. Plan a gallbladder ultrasound in the morning since she has upper abdominal tenderness and consider possible pelvic ultrasound to review the etiology of her pelvic fluid. May recommend a GI and/or a GYN consult for further evaluation. I mentioned the possibility of upper endoscopy to the family. At the present time I do not think she has any strong indications for that diagnostic procedure. Will try to treat her symptomatically. This plan has been discussed with the patient's family and the patient and they are in agreement.   ____________________________ Carmie Endalph L. Ely III, MD rle:cms D: 07/11/2011 04:21:11 ET T: 07/11/2011 07:38:54 ET JOB#: [id number]  cc: Quentin Orealph L. Ely III, MD, <Dictator> Quentin OreALPH L ELY MD ELECTRONICALLY SIGNED 07/12/2011 0:04

## 2014-05-08 NOTE — Discharge Summary (Signed)
PATIENT NAME:  Tina Sanchez, Rosamund MR#:  [id number] DATE OF BIRTH:  [birth year]  DATE OF ADMISSION:  07/11/2011 DATE OF DISCHARGE:  07/12/2011  BRIEF HISTORY: Tina LongsRebekkah Edelman is a 24 year old girl seen in the early morning hours of the 27th with abdominal pain. The pain was primarily midepigastric with no radiation. She did not have any nausea until she came to the Emergency Room where she had one episode of vomiting after taking the CT contrast. She had a slightly elevated white blood cell count 11,000. She noted abdominal pain beginning several days prior to admission, but it was tolerable until the day of admission. She has had no previous similar symptoms. CT scan was performed which demonstrated no obvious pathology. There was no evidence of any appendiceal changes. There was some free fluid in the pelvis and some distention of both ovaries consistent with possible functional cyst. Because of her pain and obscure diagnosis she was admitted to the hospital, placed on IV rehydration. White blood cell count returned to normal. An ultrasound the following morning demonstrated no evidence of any significant biliary tract disease. The patient was informally discuss with the gastroenterology service regarding possible EGD. We elected to treat her with PPI drugs as an outpatient see how she does. Should her symptoms recur or persist an EGD is likely indicated. This plan was discussed with the family and the patient in detail and they are in agreement. She is discharged home today to be followed in the office in 1 to 2 weeks.   DISCHARGE MEDICATIONS:  1. Vicodin 1 to 2 every four hours p.r.n. pain. 2. Pepcid 20 mg p.o. b.i.d.   FINAL DISCHARGE DIAGNOSIS: Abdominal pain.   ____________________________ Carmie Endalph L. Ely III, MD rle:cms D: 07/12/2011 07:21:29 ET T: 07/12/2011 13:36:13 ET JOB#: [id number]  cc: Carmie Endalph L. Ely III, MD, <Dictator> Leanna SatoLinda M. Miles, MD Quentin OreALPH L ELY MD ELECTRONICALLY SIGNED 07/19/2011 7:52

## 2014-05-13 NOTE — Telephone Encounter (Signed)
Unable to reach this pt after multiple attempts, certified letter sent per Joellyn HaffKim Booker, CNM.

## 2014-05-23 ENCOUNTER — Ambulatory Visit (INDEPENDENT_AMBULATORY_CARE_PROVIDER_SITE_OTHER): Payer: Medicaid Other

## 2014-05-23 ENCOUNTER — Other Ambulatory Visit: Payer: Self-pay | Admitting: Women's Health

## 2014-05-23 ENCOUNTER — Encounter: Payer: Medicaid Other | Admitting: Women's Health

## 2014-05-23 DIAGNOSIS — Z36 Encounter for antenatal screening of mother: Secondary | ICD-10-CM

## 2014-05-23 DIAGNOSIS — Z363 Encounter for antenatal screening for malformations: Secondary | ICD-10-CM

## 2014-05-23 DIAGNOSIS — Z3402 Encounter for supervision of normal first pregnancy, second trimester: Secondary | ICD-10-CM

## 2014-05-23 NOTE — Progress Notes (Signed)
US [redacted]W[redacted]D measurements c/w dates,cephalic,cx 3.4cm,post pl gr 0, sdp of fluid 3.9cm,normal ov's bilat,fht 150bpm, efw 307g, anatomy complete,no obvious abn seen

## 2014-05-24 ENCOUNTER — Other Ambulatory Visit: Payer: Medicaid Other

## 2014-05-30 ENCOUNTER — Encounter: Payer: Medicaid Other | Admitting: Women's Health

## 2014-05-30 ENCOUNTER — Encounter: Payer: Self-pay | Admitting: *Deleted

## 2014-06-01 ENCOUNTER — Encounter: Payer: Self-pay | Admitting: Women's Health

## 2014-06-01 ENCOUNTER — Ambulatory Visit (INDEPENDENT_AMBULATORY_CARE_PROVIDER_SITE_OTHER): Payer: Medicaid Other | Admitting: Women's Health

## 2014-06-01 VITALS — BP 130/68 | HR 84 | Wt 169.0 lb

## 2014-06-01 DIAGNOSIS — Z1389 Encounter for screening for other disorder: Secondary | ICD-10-CM

## 2014-06-01 DIAGNOSIS — A5901 Trichomonal vulvovaginitis: Secondary | ICD-10-CM

## 2014-06-01 DIAGNOSIS — O23592 Infection of other part of genital tract in pregnancy, second trimester: Secondary | ICD-10-CM

## 2014-06-01 DIAGNOSIS — A749 Chlamydial infection, unspecified: Secondary | ICD-10-CM

## 2014-06-01 DIAGNOSIS — Z3402 Encounter for supervision of normal first pregnancy, second trimester: Secondary | ICD-10-CM

## 2014-06-01 DIAGNOSIS — Z331 Pregnant state, incidental: Secondary | ICD-10-CM

## 2014-06-01 DIAGNOSIS — O98312 Other infections with a predominantly sexual mode of transmission complicating pregnancy, second trimester: Secondary | ICD-10-CM

## 2014-06-01 DIAGNOSIS — O98812 Other maternal infectious and parasitic diseases complicating pregnancy, second trimester: Secondary | ICD-10-CM

## 2014-06-01 LAB — POCT URINALYSIS DIPSTICK
Blood, UA: NEGATIVE
Glucose, UA: NEGATIVE
Leukocytes, UA: NEGATIVE
Nitrite, UA: NEGATIVE
Protein, UA: NEGATIVE

## 2014-06-01 MED ORDER — METRONIDAZOLE 500 MG PO TABS: 2000.0000 mg | ORAL_TABLET | Freq: Once | ORAL | Status: DC

## 2014-06-01 MED ORDER — AZITHROMYCIN 500 MG PO TABS: 1000.0000 mg | ORAL_TABLET | Freq: Once | ORAL | Status: DC

## 2014-06-01 NOTE — Progress Notes (Signed)
Low-risk OB appointment G1P0 7828w5d Estimated Date of Delivery: 10/07/14 BP 130/68 mmHg  Pulse 84  Wt 169 lb (76.658 kg)  LMP  (LMP Unknown)  BP, weight, and urine reviewed.  Refer to obstetrical flow sheet for FH & FHR.  Reports good fm.  Denies regular uc's, lof, vb, or uti s/s. No complaints. Notified of +CT & trich from new ob visit- have tried multiple times to contact her & sent certified letter. We do have correct # for her, still needs to set up vm box.  Resent azithromycin & flagyl. Will call us back w/ partner's info to treat him. No sex x at least 7d from both being treated, poc in 4wks.  Reviewed ptl s/s,fm, normal anatomy u/s on 5/9. Plan:  Continue routine obstetrical care  F/U in 4wks for OB appointment and ct/trich poc

## 2014-06-01 NOTE — Patient Instructions (Addendum)
NO SEX FOR AT LEAST 7 DAYS SINCE YOU HAVE BOTH TAKEN YOUR MEDICINE CALL US BACK WITH YOUR PARTNERS INFORMATION  Second Trimester of Pregnancy The second trimester is from week 13 through week 28, months 4 through 6. The second trimester is often a time when you feel your best. Your body has also adjusted to being pregnant, and you begin to feel better physically. Usually, morning sickness has lessened or quit completely, you may have more energy, and you may have an increase in appetite. The second trimester is also a time when the fetus is growing rapidly. At the end of the sixth month, the fetus is about 9 inches long and weighs about 1 pounds. You will likely begin to feel the baby move (quickening) between 18 and 20 weeks of the pregnancy. BODY CHANGES Your body goes through many changes during pregnancy. The changes vary from woman to woman.   Your weight will continue to increase. You will notice your lower abdomen bulging out.  You may begin to get stretch marks on your hips, abdomen, and breasts.  You may develop headaches that can be relieved by medicines approved by your health care provider.  You may urinate more often because the fetus is pressing on your bladder.  You may develop or continue to have heartburn as a result of your pregnancy.  You may develop constipation because certain hormones are causing the muscles that push waste through your intestines to slow down.  You may develop hemorrhoids or swollen, bulging veins (varicose veins).  You may have back pain because of the weight gain and pregnancy hormones relaxing your joints between the bones in your pelvis and as a result of a shift in weight and the muscles that support your balance.  Your breasts will continue to grow and be tender.  Your gums may bleed and may be sensitive to brushing and flossing.  Dark spots or blotches (chloasma, mask of pregnancy) may develop on your face. This will likely fade after the  baby is born.  A dark line from your belly button to the pubic area (linea nigra) may appear. This will likely fade after the baby is born.  You may have changes in your hair. These can include thickening of your hair, rapid growth, and changes in texture. Some women also have hair loss during or after pregnancy, or hair that feels dry or thin. Your hair will most likely return to normal after your baby is born. WHAT TO EXPECT AT YOUR PRENATAL VISITS During a routine prenatal visit:  You will be weighed to make sure you and the fetus are growing normally.  Your blood pressure will be taken.  Your abdomen will be measured to track your baby's growth.  The fetal heartbeat will be listened to.  Any test results from the previous visit will be discussed. Your health care provider may ask you:  How you are feeling.  If you are feeling the baby move.  If you have had any abnormal symptoms, such as leaking fluid, bleeding, severe headaches, or abdominal cramping.  If you have any questions. Other tests that may be performed during your second trimester include:  Blood tests that check for:  Low iron levels (anemia).  Gestational diabetes (between 24 and 28 weeks).  Rh antibodies.  Urine tests to check for infections, diabetes, or protein in the urine.  An ultrasound to confirm the proper growth and development of the baby.  An amniocentesis to check for possible genetic  problems.  Fetal screens for spina bifida and Down syndrome. HOME CARE INSTRUCTIONS   Avoid all smoking, herbs, alcohol, and unprescribed drugs. These chemicals affect the formation and growth of the baby.  Follow your health care provider's instructions regarding medicine use. There are medicines that are either safe or unsafe to take during pregnancy.  Exercise only as directed by your health care provider. Experiencing uterine cramps is a good sign to stop exercising.  Continue to eat regular, healthy  meals.  Wear a good support bra for breast tenderness.  Do not use hot tubs, steam rooms, or saunas.  Wear your seat belt at all times when driving.  Avoid raw meat, uncooked cheese, cat litter boxes, and soil used by cats. These carry germs that can cause birth defects in the baby.  Take your prenatal vitamins.  Try taking a stool softener (if your health care provider approves) if you develop constipation. Eat more high-fiber foods, such as fresh vegetables or fruit and whole grains. Drink plenty of fluids to keep your urine clear or pale yellow.  Take warm sitz baths to soothe any pain or discomfort caused by hemorrhoids. Use hemorrhoid cream if your health care provider approves.  If you develop varicose veins, wear support hose. Elevate your feet for 15 minutes, 3-4 times a day. Limit salt in your diet.  Avoid heavy lifting, wear low heel shoes, and practice good posture.  Rest with your legs elevated if you have leg cramps or low back pain.  Visit your dentist if you have not gone yet during your pregnancy. Use a soft toothbrush to brush your teeth and be gentle when you floss.  A sexual relationship may be continued unless your health care provider directs you otherwise.  Continue to go to all your prenatal visits as directed by your health care provider. SEEK MEDICAL CARE IF:   You have dizziness.  You have mild pelvic cramps, pelvic pressure, or nagging pain in the abdominal area.  You have persistent nausea, vomiting, or diarrhea.  You have a bad smelling vaginal discharge.  You have pain with urination. SEEK IMMEDIATE MEDICAL CARE IF:   You have a fever.  You are leaking fluid from your vagina.  You have spotting or bleeding from your vagina.  You have severe abdominal cramping or pain.  You have rapid weight gain or loss.  You have shortness of breath with chest pain.  You notice sudden or extreme swelling of your face, hands, ankles, feet, or  legs.  You have not felt your baby move in over an hour.  You have severe headaches that do not go away with medicine.  You have vision changes. Document Released: 12/25/2000 Document Revised: 01/05/2013 Document Reviewed: 03/03/2012 [facility] Patient Information 2015 [place], [place]. This information is not intended to replace advice given to you by your health care provider. Make sure you discuss any questions you have with your health care provider.

## 2014-06-29 ENCOUNTER — Encounter: Payer: Self-pay | Admitting: Advanced Practice Midwife

## 2014-06-29 ENCOUNTER — Ambulatory Visit (INDEPENDENT_AMBULATORY_CARE_PROVIDER_SITE_OTHER): Payer: Medicaid Other | Admitting: Advanced Practice Midwife

## 2014-06-29 VITALS — BP 122/60 | HR 76 | Wt 172.0 lb

## 2014-06-29 DIAGNOSIS — Z3A25 25 weeks gestation of pregnancy: Secondary | ICD-10-CM

## 2014-06-29 DIAGNOSIS — Z113 Encounter for screening for infections with a predominantly sexual mode of transmission: Secondary | ICD-10-CM

## 2014-06-29 DIAGNOSIS — Z331 Pregnant state, incidental: Secondary | ICD-10-CM

## 2014-06-29 DIAGNOSIS — Z1389 Encounter for screening for other disorder: Secondary | ICD-10-CM

## 2014-06-29 DIAGNOSIS — Z3402 Encounter for supervision of normal first pregnancy, second trimester: Secondary | ICD-10-CM

## 2014-06-29 LAB — POCT URINALYSIS DIPSTICK
Blood, UA: NEGATIVE
GLUCOSE UA: NEGATIVE
Ketones, UA: NEGATIVE
LEUKOCYTES UA: NEGATIVE
NITRITE UA: NEGATIVE
Protein, UA: NEGATIVE

## 2014-06-29 NOTE — Progress Notes (Signed)
G1P0 [redacted]w[redacted]d Estimated Date of Delivery: 10/07/14  Blood pressure 122/60, pulse 76, weight 172 lb (78.019 kg).   BP weight and urine results all reviewed and noted.  Please refer to the obstetrical flow sheet for the fundal height and fetal heart rate documentation:  Patient reports good fetal movement, denies any bleeding and no rupture of membranes symptoms or regular contractions. Patient is without complaints. All questions were answered.  Plan:  Continued routine obstetrical care, POC for CT/Trich (hasn't seen guy who gave them to her, he won't answer his phone).   Follow up in 3 weeks for OB appointment, PN2

## 2014-07-01 LAB — GC/CHLAMYDIA PROBE AMP
Chlamydia trachomatis, NAA: NEGATIVE
Neisseria gonorrhoeae by PCR: NEGATIVE

## 2014-07-01 LAB — TRICHOMONAS VAGINALIS, PROBE AMP: Trich vag by NAA: NEGATIVE

## 2014-07-14 ENCOUNTER — Ambulatory Visit (INDEPENDENT_AMBULATORY_CARE_PROVIDER_SITE_OTHER): Payer: Medicaid Other | Admitting: Advanced Practice Midwife

## 2014-07-14 ENCOUNTER — Encounter: Payer: Self-pay | Admitting: Advanced Practice Midwife

## 2014-07-14 ENCOUNTER — Other Ambulatory Visit: Payer: Medicaid Other

## 2014-07-14 VITALS — BP 108/80 | HR 80 | Wt 171.0 lb

## 2014-07-14 DIAGNOSIS — A5901 Trichomonal vulvovaginitis: Secondary | ICD-10-CM

## 2014-07-14 DIAGNOSIS — Z369 Encounter for antenatal screening, unspecified: Secondary | ICD-10-CM

## 2014-07-14 DIAGNOSIS — Z3402 Encounter for supervision of normal first pregnancy, second trimester: Secondary | ICD-10-CM

## 2014-07-14 DIAGNOSIS — Z131 Encounter for screening for diabetes mellitus: Secondary | ICD-10-CM

## 2014-07-14 DIAGNOSIS — Z331 Pregnant state, incidental: Secondary | ICD-10-CM

## 2014-07-14 DIAGNOSIS — F1290 Cannabis use, unspecified, uncomplicated: Secondary | ICD-10-CM

## 2014-07-14 DIAGNOSIS — O98312 Other infections with a predominantly sexual mode of transmission complicating pregnancy, second trimester: Secondary | ICD-10-CM

## 2014-07-14 DIAGNOSIS — Z1389 Encounter for screening for other disorder: Secondary | ICD-10-CM

## 2014-07-14 DIAGNOSIS — O23592 Infection of other part of genital tract in pregnancy, second trimester: Secondary | ICD-10-CM

## 2014-07-14 DIAGNOSIS — A749 Chlamydial infection, unspecified: Secondary | ICD-10-CM

## 2014-07-14 DIAGNOSIS — A599 Trichomoniasis, unspecified: Secondary | ICD-10-CM

## 2014-07-14 DIAGNOSIS — O98812 Other maternal infectious and parasitic diseases complicating pregnancy, second trimester: Secondary | ICD-10-CM

## 2014-07-14 LAB — POCT URINALYSIS DIPSTICK
Blood, UA: NEGATIVE
Glucose, UA: NEGATIVE
Ketones, UA: NEGATIVE
Nitrite, UA: NEGATIVE
Protein, UA: NEGATIVE

## 2014-07-14 NOTE — Progress Notes (Signed)
G1P0 3258w6d Estimated Date of Delivery: 10/07/14  Blood pressure 108/80, pulse 80, weight 171 lb (77.565 kg).   BP weight and urine results all reviewed and noted.  Please refer to the obstetrical flow sheet for the fundal height and fetal heart rate documentation:  POC trich and CT were negative.   Patient reports good fetal movement, denies any bleeding and no rupture of membranes symptoms or regular contractions. Patient states that she has some mild constipation.  Advised prunes, water, fiber.  Uses prep H prn hemorrhoids with good results.  All questions were answered.  Plan:  Continued routine obstetrical care, {PN2 today Repeat UDS (MJ use) Follow up in 3 weeks for OB appointment,

## 2014-07-15 ENCOUNTER — Encounter: Payer: Self-pay | Admitting: Advanced Practice Midwife

## 2014-07-15 LAB — HIV ANTIBODY (ROUTINE TESTING W REFLEX): HIV SCREEN 4TH GENERATION: NONREACTIVE

## 2014-07-15 LAB — CBC
Hematocrit: 32 % — ABNORMAL LOW (ref 34.0–46.6)
Hemoglobin: 10.6 g/dL — ABNORMAL LOW (ref 11.1–15.9)
MCH: 26.6 pg (ref 26.6–33.0)
MCHC: 33.1 g/dL (ref 31.5–35.7)
MCV: 80 fL (ref 79–97)
Platelets: 213 10*3/uL (ref 150–379)
RBC: 3.99 x10E6/uL (ref 3.77–5.28)
RDW: 13.6 % (ref 12.3–15.4)
WBC: 7.5 10*3/uL (ref 3.4–10.8)

## 2014-07-15 LAB — GLUCOSE TOLERANCE, 2 HOURS W/ 1HR
GLUCOSE, 2 HOUR: 94 mg/dL (ref 65–152)
GLUCOSE, FASTING: 71 mg/dL (ref 65–91)
Glucose, 1 hour: 98 mg/dL (ref 65–179)

## 2014-07-15 LAB — GC/CHLAMYDIA PROBE AMP
Chlamydia trachomatis, NAA: NEGATIVE
NEISSERIA GONORRHOEAE BY PCR: NEGATIVE

## 2014-07-15 LAB — HSV 2 ANTIBODY, IGG: HSV 2 Glycoprotein G Ab, IgG: 14.1 index — ABNORMAL HIGH (ref 0.00–0.90)

## 2014-07-15 LAB — ANTIBODY SCREEN: ANTIBODY SCREEN: NEGATIVE

## 2014-07-15 LAB — RPR: RPR Ser Ql: NONREACTIVE

## 2014-07-16 LAB — PMP SCREEN PROFILE (10S), URINE
Amphetamine Screen, Ur: NEGATIVE ng/mL
Barbiturate Screen, Ur: NEGATIVE ng/mL
Benzodiazepine Screen, Urine: NEGATIVE ng/mL
Cannabinoids Ur Ql Scn: NEGATIVE ng/mL
Cocaine(Metab.)Screen, Urine: NEGATIVE ng/mL
Creatinine(Crt), U: 108 mg/dL (ref 20.0–300.0)
Methadone Scn, Ur: NEGATIVE ng/mL
Opiate Scrn, Ur: NEGATIVE ng/mL
Oxycodone+Oxymorphone Ur Ql Scn: NEGATIVE ng/mL
PCP Scrn, Ur: NEGATIVE ng/mL
Ph of Urine: 7.6 (ref 4.5–8.9)
Propoxyphene, Screen: NEGATIVE ng/mL

## 2014-07-23 LAB — TRICHOMONAS VAGINALIS, PROBE AMP: Trich vag by NAA: NEGATIVE

## 2014-08-04 ENCOUNTER — Encounter: Payer: Self-pay | Admitting: Advanced Practice Midwife

## 2014-08-04 ENCOUNTER — Ambulatory Visit (INDEPENDENT_AMBULATORY_CARE_PROVIDER_SITE_OTHER): Payer: Medicaid Other | Admitting: Advanced Practice Midwife

## 2014-08-04 VITALS — BP 110/62 | HR 95 | Wt 176.0 lb

## 2014-08-04 DIAGNOSIS — B009 Herpesviral infection, unspecified: Secondary | ICD-10-CM | POA: Insufficient documentation

## 2014-08-04 DIAGNOSIS — Z331 Pregnant state, incidental: Secondary | ICD-10-CM

## 2014-08-04 DIAGNOSIS — Z1389 Encounter for screening for other disorder: Secondary | ICD-10-CM

## 2014-08-04 DIAGNOSIS — Z3403 Encounter for supervision of normal first pregnancy, third trimester: Secondary | ICD-10-CM

## 2014-08-04 LAB — POCT URINALYSIS DIPSTICK
Blood, UA: NEGATIVE
KETONES UA: NEGATIVE
NITRITE UA: NEGATIVE
Protein, UA: NEGATIVE

## 2014-08-04 NOTE — Patient Instructions (Signed)
[facility]

## 2014-08-04 NOTE — Progress Notes (Signed)
G1P0 108w6d Estimated Date of Delivery: 10/07/14  Blood pressure 110/62, pulse 95, weight 176 lb (79.833 kg).   BP weight and urine results all reviewed and noted.  Please refer to the obstetrical flow sheet for the fundal height and fetal heart rate documentation:  Patient reports good fetal movement, denies any bleeding and no rupture of membranes symptoms or regular contractions. Patient is without complaints. All questions were answered.  Plan:  Continued routine obstetrical care,   Follow up in 2 weeks for OB appointment,

## 2014-08-16 ENCOUNTER — Telehealth: Payer: Self-pay | Admitting: Women's Health

## 2014-08-16 NOTE — Telephone Encounter (Signed)
Pt stated ,went to bathroom voided ,when she wiped saw bright red blood on tissue x 1. No sex or BM.+ fetal heart beat. Advise pt monitor bleeding ,if continues call office back, we will see her today.

## 2014-08-18 ENCOUNTER — Encounter: Payer: Self-pay | Admitting: Advanced Practice Midwife

## 2014-08-18 ENCOUNTER — Ambulatory Visit (INDEPENDENT_AMBULATORY_CARE_PROVIDER_SITE_OTHER): Payer: Medicaid Other | Admitting: Advanced Practice Midwife

## 2014-08-18 VITALS — BP 122/80 | HR 95 | Wt 177.0 lb

## 2014-08-18 DIAGNOSIS — Z1389 Encounter for screening for other disorder: Secondary | ICD-10-CM

## 2014-08-18 DIAGNOSIS — Z331 Pregnant state, incidental: Secondary | ICD-10-CM

## 2014-08-18 DIAGNOSIS — Z3403 Encounter for supervision of normal first pregnancy, third trimester: Secondary | ICD-10-CM

## 2014-08-18 LAB — POCT URINALYSIS DIPSTICK
Glucose, UA: NEGATIVE
Nitrite, UA: NEGATIVE
PROTEIN UA: NEGATIVE
RBC UA: NEGATIVE

## 2014-08-18 NOTE — Patient Instructions (Signed)
AM I IN LABOR? What is labor? Labor is the work that your body does to birth your baby. Your uterus (the womb) contracts. Your cervix (the mouth of the uterus) opens. You will push your baby out into the world.  What do contractions (labor pains) feel like? When they first start, contractions usually feel like cramps during your period. Sometimes you feel pain in your back. Most often, contractions feel like muscles pulling painfully in your lower belly. At first, the contractions will probably be 15 to 20 minutes apart. They will not feel too painful. As labor goes on, the contractions get stronger, closer together, and more painful.  How do I time the contractions? Time your contractions by counting the number of minutes from the start of one contraction to the start of the next contraction.  What should I do when the contractions start? If it is night and you can sleep, sleep. If it happens during the day, here are some things you can do to take care of yourself at home: ? Walk. If the pains you are having are real labor, walking will make the contractions come faster and harder. If the contractions are not going to continue and be real labor, walking will make the contractions slow down. ? Take a shower or bath. This will help you relax. ? Eat. Labor is a big event. It takes a lot of energy. ? Drink water. Not drinking enough water can cause false labor (contractions that hurt but do not open your cervix). If this is true labor, drinking water will help you have strength to get through your labor. ? Take a nap. Get all the rest you can. ? Get a massage. If your labor is in your back, a strong massage on your lower back may feel very good. Getting a foot massage is always good. ? Don't panic. You can do this. Your body was made for this. You are strong!  When should I go to the hospital or call my health care provider? ? Your contractions have been 5 minutes apart or less for at least 1  hour. ? If several contractions are so painful you cannot walk or talk during one. ? Your bag of waters breaks. (You may have a big gush of water or just water that runs down your legs when you walk.)  Are there other reasons to call my health care provider? Yes, you should call your health care provider or go to the hospital if you start to bleed like you are having a period- blood that soaks your underwear or runs down your legs, if you have sudden severe pain, if your baby has not moved for several hours, or if you are leaking green fluid. The rule is as follows: If you are very concerned about something, call.   [phone] to set up tour (Thursday evenings)

## 2014-08-18 NOTE — Progress Notes (Signed)
G1P0 [redacted]w[redacted]d Estimated Date of Delivery: 10/07/14  Blood pressure 122/80, pulse 95, weight 177 lb (80.287 kg).   BP weight and urine results all reviewed and noted.  Please refer to the obstetrical flow sheet for the fundal height and fetal heart rate documentation:  Patient reports good fetal movement, denies any bleeding and no rupture of membranes symptoms or regular contractions. Patient is without complaints. All questions were answered. Did not discuss HSV dt her father being here.    Plan:  Continued routine obstetrical care,   Follow up in 2 weeks for OB appointment,

## 2014-09-01 ENCOUNTER — Ambulatory Visit (INDEPENDENT_AMBULATORY_CARE_PROVIDER_SITE_OTHER): Payer: Medicaid Other | Admitting: Advanced Practice Midwife

## 2014-09-01 VITALS — BP 126/82 | HR 84 | Wt 182.5 lb

## 2014-09-01 DIAGNOSIS — Z331 Pregnant state, incidental: Secondary | ICD-10-CM

## 2014-09-01 DIAGNOSIS — Z3403 Encounter for supervision of normal first pregnancy, third trimester: Secondary | ICD-10-CM

## 2014-09-01 DIAGNOSIS — Z1389 Encounter for screening for other disorder: Secondary | ICD-10-CM

## 2014-09-01 DIAGNOSIS — O26843 Uterine size-date discrepancy, third trimester: Secondary | ICD-10-CM

## 2014-09-01 LAB — POCT URINALYSIS DIPSTICK
GLUCOSE UA: NEGATIVE
Ketones, UA: NEGATIVE
NITRITE UA: NEGATIVE
PROTEIN UA: NEGATIVE
RBC UA: NEGATIVE

## 2014-09-01 NOTE — Patient Instructions (Signed)

## 2014-09-01 NOTE — Progress Notes (Signed)
G1P0 [redacted]w[redacted]d Estimated Date of Delivery: 10/07/14  Blood pressure 126/82, pulse 84, weight 182 lb 8 oz (82.781 kg).   BP weight and urine results all reviewed and noted.  Please refer to the obstetrical flow sheet for the fundal height and fetal heart rate documentation: size , dates  Patient reports good fetal movement, denies any bleeding and no rupture of membranes symptoms or regular contractions. Patient is without complaints. All questions were answered.  Plan:  Continued routine obstetrical care,   Follow up in 1weeks for OB appointment, GBS EFW/AFI dt size < dates

## 2014-09-07 ENCOUNTER — Ambulatory Visit (INDEPENDENT_AMBULATORY_CARE_PROVIDER_SITE_OTHER): Payer: Medicaid Other

## 2014-09-07 ENCOUNTER — Ambulatory Visit (INDEPENDENT_AMBULATORY_CARE_PROVIDER_SITE_OTHER): Payer: Medicaid Other | Admitting: Advanced Practice Midwife

## 2014-09-07 VITALS — BP 108/76 | HR 84 | Wt 182.0 lb

## 2014-09-07 DIAGNOSIS — Z1389 Encounter for screening for other disorder: Secondary | ICD-10-CM

## 2014-09-07 DIAGNOSIS — Z331 Pregnant state, incidental: Secondary | ICD-10-CM

## 2014-09-07 DIAGNOSIS — Z3403 Encounter for supervision of normal first pregnancy, third trimester: Secondary | ICD-10-CM

## 2014-09-07 DIAGNOSIS — O26843 Uterine size-date discrepancy, third trimester: Secondary | ICD-10-CM | POA: Diagnosis not present

## 2014-09-07 LAB — POCT URINALYSIS DIPSTICK
Glucose, UA: NEGATIVE
Ketones, UA: NEGATIVE
NITRITE UA: NEGATIVE
PROTEIN UA: NEGATIVE
RBC UA: NEGATIVE

## 2014-09-07 NOTE — Patient Instructions (Signed)

## 2014-09-07 NOTE — Progress Notes (Signed)
G1P0 [redacted]w[redacted]d Estimated Date of Delivery: 10/07/14  There were no vitals taken for this visit.   BP weight and urine results all reviewed and noted.  Please refer to the obstetrical flow sheet for the fundal height and fetal heart rate documentation: [place] today for size<dates:   [place] 35+5wks,efw 2468g 26%,cephalic,post pl gr 3,fht 148bpm,normal ov's bilat,afi 13.2cm         Patient reports good fetal movement, denies any bleeding and no rupture of membranes symptoms or regular contractions. Patient is without complaints. All questions were answered.  Plan:  Continued routine obstetrical care,   Follow up in 1 weeks for OB appointment,

## 2014-09-07 NOTE — Progress Notes (Signed)
[place] 35+5wks,efw 2468g 26%,cephalic,post pl gr 3,fht 148bpm,normal ov's bilat,afi 13.2cm

## 2014-09-14 ENCOUNTER — Encounter: Payer: Self-pay | Admitting: Women's Health

## 2014-09-14 ENCOUNTER — Ambulatory Visit (INDEPENDENT_AMBULATORY_CARE_PROVIDER_SITE_OTHER): Payer: Medicaid Other | Admitting: Women's Health

## 2014-09-14 VITALS — BP 122/70 | HR 72 | Wt 187.0 lb

## 2014-09-14 DIAGNOSIS — Z331 Pregnant state, incidental: Secondary | ICD-10-CM

## 2014-09-14 DIAGNOSIS — Z3403 Encounter for supervision of normal first pregnancy, third trimester: Secondary | ICD-10-CM

## 2014-09-14 DIAGNOSIS — N898 Other specified noninflammatory disorders of vagina: Secondary | ICD-10-CM | POA: Diagnosis not present

## 2014-09-14 DIAGNOSIS — Z1159 Encounter for screening for other viral diseases: Secondary | ICD-10-CM

## 2014-09-14 DIAGNOSIS — Z3685 Encounter for antenatal screening for Streptococcus B: Secondary | ICD-10-CM

## 2014-09-14 DIAGNOSIS — Z1389 Encounter for screening for other disorder: Secondary | ICD-10-CM

## 2014-09-14 DIAGNOSIS — Z118 Encounter for screening for other infectious and parasitic diseases: Secondary | ICD-10-CM

## 2014-09-14 DIAGNOSIS — B009 Herpesviral infection, unspecified: Secondary | ICD-10-CM

## 2014-09-14 DIAGNOSIS — O26893 Other specified pregnancy related conditions, third trimester: Secondary | ICD-10-CM

## 2014-09-14 LAB — POCT WET PREP (WET MOUNT): Clue Cells Wet Prep Whiff POC: NEGATIVE

## 2014-09-14 LAB — POCT URINALYSIS DIPSTICK
GLUCOSE UA: NEGATIVE
KETONES UA: NEGATIVE
Leukocytes, UA: NEGATIVE
Nitrite, UA: NEGATIVE
Protein, UA: NEGATIVE
RBC UA: NEGATIVE

## 2014-09-14 LAB — OB RESULTS CONSOLE GBS
GBS: POSITIVE
STREP GROUP B AG: NEGATIVE

## 2014-09-14 LAB — OB RESULTS CONSOLE GC/CHLAMYDIA
CHLAMYDIA, DNA PROBE: NEGATIVE
Gonorrhea: NEGATIVE

## 2014-09-14 MED ORDER — ACYCLOVIR 400 MG PO TABS: 400.0000 mg | ORAL_TABLET | Freq: Three times a day (TID) | ORAL | Status: DC

## 2014-09-14 NOTE — Patient Instructions (Addendum)
Monistat 7 for yeast infection  [place] Pediatricians/Family Doctors:  [facility] [phone]            [facility] [phone]                 [facility] [phone] (usually not accepting new patients unless you have family there already, you are always welcome to call and ask)            [facility] ([address]) [phone]   [facility] Pediatricians/Family Doctors:   [facility]: [phone]  [facility]: [phone]   Call the office [phone]) or go to [facility] if:  You begin to have strong, frequent contractions  Your water breaks.  Sometimes it is a big gush of fluid, sometimes it is just a trickle that keeps getting your panties wet or running down your legs  You have vaginal bleeding.  It is normal to have a small amount of spotting if your cervix was checked.   You don't feel your baby moving like normal.  If you don't, get you something to eat and drink and lay down and focus on feeling your baby move.  You should feel at least 10 movements in 2 hours.  If you don't, you should call the office or go to [facility].    [facility] Contractions Contractions of the uterus can occur throughout pregnancy. Contractions are not always a sign that you are in labor.  WHAT ARE BRAXTON HICKS CONTRACTIONS?  Contractions that occur before labor are called Braxton Hicks contractions, or false labor. Toward the end of pregnancy (32-34 weeks), these contractions can develop more often and may become more forceful. This is not true labor because these contractions do not result in opening (dilatation) and thinning of the cervix. They are sometimes difficult to tell apart from true labor because these contractions can be forceful and people have different pain tolerances. You should not feel embarrassed if you go to the hospital with false labor. Sometimes, the only way to  tell if you are in true labor is for your health care provider to look for changes in the cervix. If there are no prenatal problems or other health problems associated with the pregnancy, it is completely safe to be sent home with false labor and await the onset of true labor. HOW CAN YOU TELL THE DIFFERENCE BETWEEN TRUE AND FALSE LABOR? False Labor  The contractions of false labor are usually shorter and not as hard as those of true labor.   The contractions are usually irregular.   The contractions are often felt in the front of the lower abdomen and in the groin.   The contractions may go away when you walk around or change positions while lying down.   The contractions get weaker and are shorter lasting as time goes on.   The contractions do not usually become progressively stronger, regular, and closer together as with true labor.  True Labor  Contractions in true labor last 30-70 seconds, become very regular, usually become more intense, and increase in frequency.   The contractions do not go away with walking.   The discomfort is usually felt in the top of the uterus and spreads to the lower abdomen and low back.   True labor can be determined by your health care provider with an exam. This will show that the cervix is dilating and getting thinner.  WHAT TO REMEMBER  Keep up with your  usual exercises and follow other instructions given by your health care provider.   Take medicines as directed by your health care provider.   Keep your regular prenatal appointments.   Eat and drink lightly if you think you are going into labor.   If Braxton Hicks contractions are making you uncomfortable:   Change your position from lying down or resting to walking, or from walking to resting.   Sit and rest in a tub of warm water.   Drink 2-3 glasses of water. Dehydration may cause these contractions.   Do slow and deep breathing several times an hour.  WHEN  SHOULD I SEEK IMMEDIATE MEDICAL CARE? Seek immediate medical care if:  Your contractions become stronger, more regular, and closer together.   You have fluid leaking or gushing from your vagina.   You have a fever.   You pass blood-tinged mucus.   You have vaginal bleeding.   You have continuous abdominal pain.   You have low back pain that you never had before.   You feel your baby's head pushing down and causing pelvic pressure.   Your baby is not moving as much as it used to.  Document Released: 12/31/2004 Document Revised: 01/05/2013 Document Reviewed: 10/12/2012 [facility] Patient Information 2015 [place], [place]. This information is not intended to replace advice given to you by your health care provider. Make sure you discuss any questions you have with your health care provider.

## 2014-09-14 NOTE — Progress Notes (Signed)
Low-risk OB appointment G1P0 [redacted]w[redacted]d Estimated Date of Delivery: 10/07/14 BP 122/70 mmHg  Pulse 72  Wt 187 lb (84.823 kg)  LMP  (LMP Unknown)  BP, weight, and urine reviewed.  Refer to obstetrical flow sheet for FH & FHR.  Reports good fm.  Denies regular uc's, lof, vb, or uti s/s. No complaints. GBS collected SVE per request: 1/70/-2, vtx- glove full of thick clumpy yellow d/c, wet prep +yeast- recommended otc monistat 7 Notified of +HSV2, rx for acyclovir for suppression sent in to begin asap Reviewed ptl s/s, fkc. Plan:  Continue routine obstetrical care  F/U in 1wk for OB appointment

## 2014-09-16 LAB — GC/CHLAMYDIA PROBE AMP
Chlamydia trachomatis, NAA: NEGATIVE
Neisseria gonorrhoeae by PCR: NEGATIVE

## 2014-09-17 LAB — CULTURE, BETA STREP (GROUP B ONLY): Strep Gp B Culture: POSITIVE — AB

## 2014-09-19 LAB — OB RESULTS CONSOLE GBS: GBS: POSITIVE

## 2014-09-21 ENCOUNTER — Ambulatory Visit (INDEPENDENT_AMBULATORY_CARE_PROVIDER_SITE_OTHER): Payer: Medicaid Other | Admitting: Advanced Practice Midwife

## 2014-09-21 VITALS — BP 120/80 | HR 92 | Wt 188.0 lb

## 2014-09-21 DIAGNOSIS — Z331 Pregnant state, incidental: Secondary | ICD-10-CM

## 2014-09-21 DIAGNOSIS — Z1389 Encounter for screening for other disorder: Secondary | ICD-10-CM

## 2014-09-21 DIAGNOSIS — Z3403 Encounter for supervision of normal first pregnancy, third trimester: Secondary | ICD-10-CM

## 2014-09-21 LAB — POCT URINALYSIS DIPSTICK
Blood, UA: NEGATIVE
GLUCOSE UA: NEGATIVE
Ketones, UA: NEGATIVE
NITRITE UA: NEGATIVE
PROTEIN UA: NEGATIVE

## 2014-09-21 NOTE — Progress Notes (Signed)
G1P0 [redacted]w[redacted]d Estimated Date of Delivery: 10/07/14  There were no vitals taken for this visit.   BP weight and urine results all reviewed and noted.  Please refer to the obstetrical flow sheet for the fundal height and fetal heart rate documentation:  Patient reports good fetal movement, denies any bleeding and no rupture of membranes symptoms or regular contractions. Patient is without complaints. All questions were answered.  No orders of the defined types were placed in this encounter.    Plan:  Continued routine obstetrical care,   Return in about 1 week (around 09/28/2014) for LROB.

## 2014-09-28 ENCOUNTER — Inpatient Hospital Stay (HOSPITAL_COMMUNITY)
Admission: AD | Admit: 2014-09-28 | Discharge: 2014-10-01 | DRG: 774 | Disposition: A | Discharge status: Home / Self Care | Payer: Medicaid Other | Source: Ambulatory Visit | Attending: Family Medicine | Admitting: Family Medicine

## 2014-09-28 ENCOUNTER — Encounter: Payer: Self-pay | Admitting: Advanced Practice Midwife

## 2014-09-28 ENCOUNTER — Ambulatory Visit (INDEPENDENT_AMBULATORY_CARE_PROVIDER_SITE_OTHER): Payer: Medicaid Other | Admitting: Advanced Practice Midwife

## 2014-09-28 ENCOUNTER — Telehealth: Payer: Self-pay | Admitting: *Deleted

## 2014-09-28 ENCOUNTER — Encounter (HOSPITAL_COMMUNITY): Payer: Self-pay | Admitting: *Deleted

## 2014-09-28 VITALS — BP 128/90 | HR 94 | Wt 191.0 lb

## 2014-09-28 DIAGNOSIS — Z2839 Other underimmunization status: Secondary | ICD-10-CM | POA: Diagnosis present

## 2014-09-28 DIAGNOSIS — Z283 Underimmunization status: Secondary | ICD-10-CM

## 2014-09-28 DIAGNOSIS — Z3490 Encounter for supervision of normal pregnancy, unspecified, unspecified trimester: Secondary | ICD-10-CM

## 2014-09-28 DIAGNOSIS — Z87891 Personal history of nicotine dependence: Secondary | ICD-10-CM | POA: Diagnosis not present

## 2014-09-28 DIAGNOSIS — Z331 Pregnant state, incidental: Secondary | ICD-10-CM

## 2014-09-28 DIAGNOSIS — Z8249 Family history of ischemic heart disease and other diseases of the circulatory system: Secondary | ICD-10-CM

## 2014-09-28 DIAGNOSIS — O09899 Supervision of other high risk pregnancies, unspecified trimester: Secondary | ICD-10-CM | POA: Diagnosis present

## 2014-09-28 DIAGNOSIS — Z3403 Encounter for supervision of normal first pregnancy, third trimester: Secondary | ICD-10-CM

## 2014-09-28 DIAGNOSIS — Z3A38 38 weeks gestation of pregnancy: Secondary | ICD-10-CM | POA: Diagnosis present

## 2014-09-28 DIAGNOSIS — A6000 Herpesviral infection of urogenital system, unspecified: Secondary | ICD-10-CM | POA: Diagnosis present

## 2014-09-28 DIAGNOSIS — Z0373 Encounter for suspected fetal anomaly ruled out: Secondary | ICD-10-CM

## 2014-09-28 DIAGNOSIS — O4292 Full-term premature rupture of membranes, unspecified as to length of time between rupture and onset of labor: Secondary | ICD-10-CM | POA: Diagnosis present

## 2014-09-28 DIAGNOSIS — Z1389 Encounter for screening for other disorder: Secondary | ICD-10-CM

## 2014-09-28 DIAGNOSIS — IMO0002 Reserved for concepts with insufficient information to code with codable children: Secondary | ICD-10-CM

## 2014-09-28 DIAGNOSIS — Z833 Family history of diabetes mellitus: Secondary | ICD-10-CM | POA: Diagnosis not present

## 2014-09-28 DIAGNOSIS — O9832 Other infections with a predominantly sexual mode of transmission complicating childbirth: Secondary | ICD-10-CM | POA: Diagnosis present

## 2014-09-28 DIAGNOSIS — O99824 Streptococcus B carrier state complicating childbirth: Secondary | ICD-10-CM | POA: Diagnosis present

## 2014-09-28 LAB — CBC
HEMATOCRIT: 31.1 % — AB (ref 36.0–46.0)
Hemoglobin: 10.5 g/dL — ABNORMAL LOW (ref 12.0–15.0)
MCH: 27.3 pg (ref 26.0–34.0)
MCHC: 33.8 g/dL (ref 30.0–36.0)
MCV: 80.8 fL (ref 78.0–100.0)
Platelets: 187 10*3/uL (ref 150–400)
RBC: 3.85 MIL/uL — AB (ref 3.87–5.11)
RDW: 14 % (ref 11.5–15.5)
WBC: 10.6 10*3/uL — AB (ref 4.0–10.5)

## 2014-09-28 LAB — COMPREHENSIVE METABOLIC PANEL
ALT: 15 U/L (ref 14–54)
AST: 20 U/L (ref 15–41)
Albumin: 3.4 g/dL — ABNORMAL LOW (ref 3.5–5.0)
Alkaline Phosphatase: 105 U/L (ref 38–126)
Anion gap: 10 (ref 5–15)
BILIRUBIN TOTAL: 0.4 mg/dL (ref 0.3–1.2)
BUN: 9 mg/dL (ref 6–20)
CALCIUM: 9.1 mg/dL (ref 8.9–10.3)
CO2: 23 mmol/L (ref 22–32)
CREATININE: 0.72 mg/dL (ref 0.44–1.00)
Chloride: 101 mmol/L (ref 101–111)
GFR calc Af Amer: >60 mL/min (ref >60)
Glucose, Bld: 89 mg/dL (ref 65–99)
POTASSIUM: 3.9 mmol/L (ref 3.5–5.1)
Sodium: 134 mmol/L — ABNORMAL LOW (ref 135–145)
TOTAL PROTEIN: 6.7 g/dL (ref 6.5–8.1)

## 2014-09-28 LAB — POCT URINALYSIS DIPSTICK
Glucose, UA: NEGATIVE
KETONES UA: NEGATIVE
Leukocytes, UA: NEGATIVE
Nitrite, UA: NEGATIVE
PROTEIN UA: NEGATIVE
RBC UA: NEGATIVE

## 2014-09-28 LAB — TYPE AND SCREEN
ABO/RH(D): A POS
Antibody Screen: NEGATIVE

## 2014-09-28 LAB — RAPID URINE DRUG SCREEN, HOSP PERFORMED
AMPHETAMINES: NOT DETECTED
Barbiturates: NOT DETECTED
Benzodiazepines: NOT DETECTED
Cocaine: NOT DETECTED
OPIATES: NOT DETECTED
TETRAHYDROCANNABINOL: NOT DETECTED

## 2014-09-28 LAB — PROTEIN / CREATININE RATIO, URINE
Creatinine, Urine: 150 mg/dL
Protein Creatinine Ratio: 0.12 mg/mg{Cre} (ref 0.00–0.15)
TOTAL PROTEIN, URINE: 18 mg/dL

## 2014-09-28 LAB — ABO/RH: ABO/RH(D): A POS

## 2014-09-28 MED ORDER — ONDANSETRON HCL 4 MG/2ML IJ SOLN: 4.0000 mg | Freq: Four times a day (QID) | INTRAMUSCULAR | Status: DC | PRN

## 2014-09-28 MED ORDER — OXYCODONE-ACETAMINOPHEN 5-325 MG PO TABS: 2.0000 | ORAL_TABLET | ORAL | Status: DC | PRN

## 2014-09-28 MED ORDER — OXYTOCIN 40 UNITS IN LACTATED RINGERS INFUSION - SIMPLE MED: 1.0000 m[IU]/min | INTRAVENOUS | Status: DC

## 2014-09-28 MED ORDER — OXYTOCIN BOLUS FROM INFUSION: 500.0000 mL | INTRAVENOUS | Status: DC

## 2014-09-28 MED ORDER — OXYCODONE-ACETAMINOPHEN 5-325 MG PO TABS: 1.0000 | ORAL_TABLET | ORAL | Status: DC | PRN

## 2014-09-28 MED ORDER — CITRIC ACID-SODIUM CITRATE 334-500 MG/5ML PO SOLN: 30.0000 mL | ORAL | Status: DC | PRN

## 2014-09-28 MED ORDER — ACETAMINOPHEN 325 MG PO TABS: 650.0000 mg | ORAL_TABLET | ORAL | Status: DC | PRN

## 2014-09-28 MED ORDER — OXYTOCIN 40 UNITS IN LACTATED RINGERS INFUSION - SIMPLE MED: 62.5000 mL/h | INTRAVENOUS | Status: DC

## 2014-09-28 MED ORDER — FENTANYL CITRATE (PF) 100 MCG/2ML IJ SOLN
50.0000 ug | INTRAMUSCULAR | Status: DC | PRN
  Administered 2014-09-28 (×2): 100 ug via INTRAVENOUS
  Filled 2014-09-28 (×2): qty 2

## 2014-09-28 MED ORDER — LACTATED RINGERS IV SOLN
INTRAVENOUS | Status: DC
  Administered 2014-09-28 – 2014-09-29 (×2): via INTRAVENOUS

## 2014-09-28 MED ORDER — ACYCLOVIR 400 MG PO TABS
400.0000 mg | ORAL_TABLET | Freq: Three times a day (TID) | ORAL | Status: DC
  Administered 2014-09-28 (×2): 400 mg via ORAL
  Filled 2014-09-28 (×3): qty 1

## 2014-09-28 MED ORDER — OXYTOCIN 40 UNITS IN LACTATED RINGERS INFUSION - SIMPLE MED
1.0000 m[IU]/min | INTRAVENOUS | Status: DC
  Administered 2014-09-29: 666 m[IU]/min via INTRAVENOUS
  Administered 2014-09-29: 1.333 m[IU]/min via INTRAVENOUS
  Filled 2014-09-28: qty 1000

## 2014-09-28 MED ORDER — TERBUTALINE SULFATE 1 MG/ML IJ SOLN
0.2500 mg | Freq: Once | INTRAMUSCULAR | Status: DC | PRN
  Filled 2014-09-28: qty 1

## 2014-09-28 MED ORDER — PENICILLIN G POTASSIUM 5000000 UNITS IJ SOLR
2.5000 10*6.[IU] | INTRAVENOUS | Status: DC
  Administered 2014-09-28 – 2014-09-29 (×3): 2.5 10*6.[IU] via INTRAVENOUS
  Filled 2014-09-28 (×5): qty 2.5

## 2014-09-28 MED ORDER — DEXTROSE 5 % IV SOLN
5.0000 10*6.[IU] | Freq: Once | INTRAVENOUS | Status: AC
  Administered 2014-09-28: 5 10*6.[IU] via INTRAVENOUS
  Filled 2014-09-28: qty 5

## 2014-09-28 MED ORDER — LACTATED RINGERS IV SOLN
500.0000 mL | INTRAVENOUS | Status: DC | PRN
  Administered 2014-09-29: 350 mL via INTRAVENOUS
  Administered 2014-09-29 (×2): 500 mL via INTRAVENOUS

## 2014-09-28 MED ORDER — MISOPROSTOL 200 MCG PO TABS
50.0000 ug | ORAL_TABLET | ORAL | Status: DC
  Administered 2014-09-28: 50 ug via ORAL
  Filled 2014-09-28: qty 0.5

## 2014-09-28 MED ORDER — LIDOCAINE HCL (PF) 1 % IJ SOLN
30.0000 mL | INTRAMUSCULAR | Status: DC | PRN
  Filled 2014-09-28: qty 30

## 2014-09-28 NOTE — Telephone Encounter (Signed)
Pt states just left our office from her appt had cervix checked, now having clear watery discharge, thinks her water has broken. Pt informed to come back to the office to be evaluated now. Pt verbalized understanding.

## 2014-09-28 NOTE — Progress Notes (Signed)
G1P0 [redacted]w[redacted]d Estimated Date of Delivery: 10/07/14  Blood pressure 128/90, pulse 94, weight 191 lb (86.637 kg).   BP weight and urine results all reviewed and noted.  Please refer to the obstetrical flow sheet for the fundal height and fetal heart rate documentation: NST done dt persistent tachycardia with doppler. Baby very active, probably just accels  Baseline settled down to 150's when baby quit moving.   Patient reports good fetal movement, denies any bleeding and no rupture of membranes symptoms or regular contractions. Patient is without complaints. All questions were answered.  Orders Placed This Encounter  Procedures  . POCT urinalysis dipstick    Plan:  Continued routine obstetrical care,                         Return in about 9 days (around 10/07/2014) for Blood pressure check .

## 2014-09-28 NOTE — H&P (Signed)
LABOR ADMISSION HISTORY AND PHYSICAL  Tina Sanchez is a 24 y.o. female G1P0 with IUP at [redacted]w[redacted]d by 15 wk [place] presenting for PROM.  Ms. Peraza reports that about three hours ago, she felt a gush of fluid. She went to her usual prenatal clinic [facility]) and was found to have positive pooling and ferning. She was subsequently directly admitted to L&D. +FM, denies contractions, no VB, no blurry vision, headaches or peripheral edema, and RUQ pain.  She plans on breast feeding. She requests Nexplanon for birth control.  Dating: By 15 wk [place] --->  Estimated Date of Delivery: 10/07/14  Sono:    , CWD, normal anatomy, cephalic presentation, longitudinal lie, 307g , CWD, normal anatomy, cephalic presentation, longitudinal lie, 2468g, 26%EFW   Prenatal History/Complications:   [facility]  Initiated Care at  16.3wks  FOB [place], 30yo, 2nd baby, talking  Dating By 15wk u/s  Pap 04/25/14 normal  GC/CT Initial:      -/+  Repeat -/-        36+wks:  -/-  Genetic Screen NT/IT: neg  CF screen neg  Anatomic [place] Normal female  Flu vaccine declined  Tdap Recommended ~ 28wks  Glucose Screen  2 hr 71/98/94  GBS Pos  Feed Preference Breast  Contraception Nexplanon   Circumcision n/a  Childbirth Classes Didn't make it-recommended tour  Pediatrician Undecided, info given     Past Medical History: Past Medical History  Diagnosis Date  . Medical history non-contributory   . HSV-2 (herpes simplex virus 2) infection     Past Surgical History: Past Surgical History  Procedure Laterality Date  . No past surgeries      Obstetrical History: OB History    Gravida Para Term Preterm AB TAB SAB Ectopic Multiple Living        Social History: Social History   Social History  . Marital Status: Single    Spouse Name: N/A  . Number of Children: N/A  . Years of Education: N/A   Social History Main Topics  . Smoking status: Former Games developer  .  Smokeless tobacco: Never Used  . Alcohol Use: No     Comment: occasionally  . Drug Use: Yes    Special: Marijuana     Comment: Patient had positive UDS in July 2016 negative since   . Sexual Activity: Not Currently    Birth Control/ Protection: None   Other Topics Concern  . None   Social History Narrative    Family History: Family History  Problem Relation Age of Onset  . Cancer Paternal Uncle     prostate  . Hypertension Maternal Grandmother   . Cancer Maternal Grandfather     brain  . Diabetes Paternal Grandfather     Allergies: No Known Allergies  Prescriptions prior to admission  Medication Sig Dispense Refill Last Dose  . acyclovir (ZOVIRAX) 400 MG tablet Take 1 tablet (400 mg total) by mouth 3 (three) times daily. 90 tablet 3 09/28/2014 at 1000  . Prenatal Vit-Fe Fumarate-FA (PRENATAL MULTIVITAMIN) TABS tablet Take 1 tablet by mouth daily at 12 noon.   09/28/2014 at 1000     Review of Systems   All systems reviewed and negative except as stated in HPI  BP 125/98 mmHg  Pulse 105  Temp(Src) 98.5 F (36.9 C) (Oral)  Ht  (1.626 m)  Wt 191 lb (86.637 kg)  BMI 32.77 kg/m2  LMP  (  LMP Unknown) General appearance: alert, cooperative and no distress Lungs: clear to auscultation bilaterally Heart: regular rate and rhythm Abdomen: soft, non-tender; bowel sounds normal Extremities: no sign of DVT, edema Presentation: cephalic Fetal monitoringBaseline: 155 bpm, Variability: Good {> 6 bpm) and Accelerations: Reactive Uterine activityNone Dilation: 1.5 Effacement (%): 70 Station: -2 Exam by:: Dr. Natale Milch Prenatal labs: ABO, Rh: A/--/-- (04/11 1457) Antibody: Negative (06/30 0847) Rubella:  Immune RPR: Non Reactive (06/30 0847)  HBsAg: Negative (04/11 1457)  HIV: Non-reactive (04/11 0000) Non-reactive GBS: Positive (09/05 0000) Positive 1 hr Glucola: 98 Genetic screening: no abnormalities  Anatomy US: normal female  Prenatal Transfer Tool  Maternal  Diabetes: No Genetic Screening: Normal Maternal Ultrasounds/Referrals: Normal Fetal Ultrasounds or other Referrals:  None Maternal Substance Abuse:  Yes:  Type: Marijuana Significant Maternal Medications:  Meds include: Other: Acyclovir Significant Maternal Lab Results: Lab values include: Group B Strep positive, varicella non-immune  Results for orders placed or performed during the hospital encounter of 09/28/14 (from the past 24 hour(s))  CBC   Collection Time: 09/28/14  6:30 PM  Result Value Ref Range   WBC 10.6 (H) 4.0 - 10.5 K/uL   RBC 3.85 (L) 3.87 - 5.11 MIL/uL   Hemoglobin 10.5 (L) 12.0 - 15.0 g/dL   HCT 16.1 (L) 09.6 - 04.5 %   MCV 80.8 78.0 - 100.0 fL   MCH 27.3 26.0 - 34.0 pg   MCHC 33.8 30.0 - 36.0 g/dL   RDW 40.9 81.1 - 91.4 %   Platelets 187 150 - 400 K/uL  Results for orders placed or performed in visit on 09/28/14 (from the past 24 hour(s))  POCT urinalysis dipstick   Collection Time: 09/28/14  1:43 PM  Result Value Ref Range   Color, UA     Clarity, UA     Glucose, UA neg    Bilirubin, UA     Ketones, UA neg    Spec Grav, UA     Blood, UA neg    pH, UA     Protein, UA neg    Urobilinogen, UA     Nitrite, UA neg    Leukocytes, UA Negative Negative    Patient Active Problem List   Diagnosis Date Noted  . Term pregnancy 09/28/2014  . HSV-2 (herpes simplex virus 2) infection   . Trichomonal vaginitis during pregnancy in second trimester 04/27/2014  . Chlamydia infection affecting pregnancy in second trimester, antepartum 04/27/2014  . Susceptible to varicella (non-immune), currently pregnant 04/26/2014  . 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 Supervision of normal first pregnancy 04/25/2014    Assessment: Tina Sanchez is a 24 y.o. G1P0 at [redacted]w[redacted]d here for IOL for PROM.  #Labor:Induction - placed Foley bulb, will begin cytotec #Pain: Epidural, IV pain meds in latent labor #FWB: Category 1 #ID:  GBS pos - PCN #MOF: Breast  #MOC:Nexplanon  Tina Abernethy, MD PGY-1 [facility]    OB FELLOW HISTORY AND PHYSICAL ATTESTATION  I have seen and examined this patient; I agree with above documentation in the resident's note.   Tina Sanchez is a 24 y.o. G1P0000 here for IOL 2/2 PROM at 15:00 today. No HA or visual change or ruq pain.  PE: BP 125/98 mmHg  Pulse 105  Temp(Src) 98.5 F (36.9 C) (Oral)  Ht 5\' 4"  (1.626 m)  Wt 191 lb (86.637 kg)  BMI 32.77 kg/m2  LMP  (LMP Unknown) Gen: calm comfortable, NAD Resp: normal effort, no distress Abd: gravid Cephalic  ROS, labs, PMH reviewed  Plan: MOF: both MOC: nexplanon ID: gbs positive, starting pcn, no allergy. HSV2 positive, no history active lesion, currently suppressed, denies lesions or pain/tingling PROM: no signs triple I, continue to monitor' Elevated diastolic BP: 90 in clinic today as well. Asymptomatic. Will check preeclampsia labs. FWB: cat 1 Labor: foley placed, cytotec 50 mcg po q4, then pitocin Circ: n/a Pain: eventual epidura MJ use in pregnancy: f/u uds, SW consult  Silvano Bilis 09/28/2014, 7:07 PM

## 2014-09-28 NOTE — Progress Notes (Signed)
3:55 PM Pt back in with C/O leaking fluid right after she left here. SSE:  + pooling, fern.  No ctx. To L&D.

## 2014-09-28 NOTE — Patient Instructions (Signed)

## 2014-09-28 NOTE — Progress Notes (Signed)
Pt denies any problems or concerns at this time.  

## 2014-09-29 ENCOUNTER — Inpatient Hospital Stay (HOSPITAL_COMMUNITY): Payer: Medicaid Other | Admitting: Anesthesiology

## 2014-09-29 ENCOUNTER — Encounter (HOSPITAL_COMMUNITY): Payer: Self-pay | Admitting: *Deleted

## 2014-09-29 DIAGNOSIS — Z3A38 38 weeks gestation of pregnancy: Secondary | ICD-10-CM

## 2014-09-29 DIAGNOSIS — O4292 Full-term premature rupture of membranes, unspecified as to length of time between rupture and onset of labor: Secondary | ICD-10-CM

## 2014-09-29 DIAGNOSIS — A6000 Herpesviral infection of urogenital system, unspecified: Secondary | ICD-10-CM

## 2014-09-29 DIAGNOSIS — O99824 Streptococcus B carrier state complicating childbirth: Secondary | ICD-10-CM

## 2014-09-29 DIAGNOSIS — O9832 Other infections with a predominantly sexual mode of transmission complicating childbirth: Secondary | ICD-10-CM

## 2014-09-29 DIAGNOSIS — Z87891 Personal history of nicotine dependence: Secondary | ICD-10-CM

## 2014-09-29 LAB — RPR: RPR: NONREACTIVE

## 2014-09-29 MED ORDER — TERBUTALINE SULFATE 1 MG/ML IJ SOLN
0.2500 mg | Freq: Once | INTRAMUSCULAR | Status: AC
  Administered 2014-09-29: 0.25 mg via SUBCUTANEOUS
  Filled 2014-09-29: qty 1

## 2014-09-29 MED ORDER — FENTANYL 2.5 MCG/ML BUPIVACAINE 1/10 % EPIDURAL INFUSION (WH - ANES)
14.0000 mL/h | INTRAMUSCULAR | Status: DC | PRN
  Administered 2014-09-29 (×2): 14 mL/h via EPIDURAL
  Filled 2014-09-29: qty 125

## 2014-09-29 MED ORDER — FENTANYL 2.5 MCG/ML BUPIVACAINE 1/10 % EPIDURAL INFUSION (WH - ANES): 14.0000 mL/h | INTRAMUSCULAR | Status: DC | PRN

## 2014-09-29 MED ORDER — LANOLIN HYDROUS EX OINT: TOPICAL_OINTMENT | CUTANEOUS | Status: DC | PRN

## 2014-09-29 MED ORDER — LIDOCAINE HCL (PF) 1 % IJ SOLN
INTRAMUSCULAR | Status: DC | PRN
  Administered 2014-09-29: 6 mL via EPIDURAL
  Administered 2014-09-29: 4 mL

## 2014-09-29 MED ORDER — ONDANSETRON HCL 4 MG/2ML IJ SOLN: 4.0000 mg | INTRAMUSCULAR | Status: DC | PRN

## 2014-09-29 MED ORDER — OXYCODONE-ACETAMINOPHEN 5-325 MG PO TABS
1.0000 | ORAL_TABLET | ORAL | Status: DC | PRN
  Administered 2014-09-29 – 2014-10-01 (×6): 1 via ORAL
  Filled 2014-09-29 (×6): qty 1

## 2014-09-29 MED ORDER — OXYTOCIN 40 UNITS IN LACTATED RINGERS INFUSION - SIMPLE MED: 62.5000 mL/h | INTRAVENOUS | Status: DC | PRN

## 2014-09-29 MED ORDER — EPHEDRINE 5 MG/ML INJ
10.0000 mg | INTRAVENOUS | Status: DC | PRN
Start: 2014-09-29 — End: 2014-09-29
  Filled 2014-09-29: qty 2

## 2014-09-29 MED ORDER — DIPHENHYDRAMINE HCL 25 MG PO CAPS: 25.0000 mg | ORAL_CAPSULE | Freq: Four times a day (QID) | ORAL | Status: DC | PRN

## 2014-09-29 MED ORDER — WITCH HAZEL-GLYCERIN EX PADS: 1.0000 "application " | MEDICATED_PAD | CUTANEOUS | Status: DC | PRN

## 2014-09-29 MED ORDER — FENTANYL 2.5 MCG/ML BUPIVACAINE 1/10 % EPIDURAL INFUSION (WH - ANES)
INTRAMUSCULAR | Status: AC
  Administered 2014-09-29: 14 mL/h via EPIDURAL
  Filled 2014-09-29: qty 125

## 2014-09-29 MED ORDER — ACETAMINOPHEN 325 MG PO TABS
650.0000 mg | ORAL_TABLET | ORAL | Status: DC | PRN
  Administered 2014-09-30: 650 mg via ORAL
  Filled 2014-09-29: qty 2

## 2014-09-29 MED ORDER — ONDANSETRON HCL 4 MG PO TABS: 4.0000 mg | ORAL_TABLET | ORAL | Status: DC | PRN

## 2014-09-29 MED ORDER — SIMETHICONE 80 MG PO CHEW: 80.0000 mg | CHEWABLE_TABLET | ORAL | Status: DC | PRN

## 2014-09-29 MED ORDER — DIBUCAINE 1 % RE OINT: 1.0000 "application " | TOPICAL_OINTMENT | RECTAL | Status: DC | PRN

## 2014-09-29 MED ORDER — DOCUSATE SODIUM 100 MG PO CAPS
100.0000 mg | ORAL_CAPSULE | Freq: Two times a day (BID) | ORAL | Status: DC
  Administered 2014-09-29 – 2014-10-01 (×4): 100 mg via ORAL
  Filled 2014-09-29 (×4): qty 1

## 2014-09-29 MED ORDER — DIPHENHYDRAMINE HCL 50 MG/ML IJ SOLN: 12.5000 mg | INTRAMUSCULAR | Status: DC | PRN

## 2014-09-29 MED ORDER — BENZOCAINE-MENTHOL 20-0.5 % EX AERO: 1.0000 "application " | INHALATION_SPRAY | CUTANEOUS | Status: DC | PRN

## 2014-09-29 MED ORDER — PHENYLEPHRINE 40 MCG/ML (10ML) SYRINGE FOR IV PUSH (FOR BLOOD PRESSURE SUPPORT)
80.0000 ug | PREFILLED_SYRINGE | INTRAVENOUS | Status: DC | PRN
Start: 2014-09-29 — End: 2014-09-29
  Administered 2014-09-29: 80 ug via INTRAVENOUS
  Filled 2014-09-29: qty 2

## 2014-09-29 MED ORDER — PHENYLEPHRINE 40 MCG/ML (10ML) SYRINGE FOR IV PUSH (FOR BLOOD PRESSURE SUPPORT)
PREFILLED_SYRINGE | INTRAVENOUS | Status: AC
  Filled 2014-09-29: qty 20

## 2014-09-29 MED ORDER — IBUPROFEN 600 MG PO TABS
600.0000 mg | ORAL_TABLET | Freq: Four times a day (QID) | ORAL | Status: DC
  Administered 2014-09-29 – 2014-10-01 (×8): 600 mg via ORAL
  Filled 2014-09-29 (×8): qty 1

## 2014-09-29 MED ORDER — PRENATAL MULTIVITAMIN CH
1.0000 | ORAL_TABLET | Freq: Every day | ORAL | Status: DC
  Administered 2014-09-30 – 2014-10-01 (×2): 1 via ORAL
  Filled 2014-09-29 (×2): qty 1

## 2014-09-29 NOTE — Lactation Note (Signed)
This note was copied from the chart of Tina Sanchez. Lactation Consultation Note  Mom requesting formula for infant. Discussed cons of introducing formula prior to BF being established. Infant placed to breast and latched very easily to right side in football hold audible swallows heard.  Mom's milk easy to express. Advised mom to BF well on both sides prior to offering formula to infant. Discussed normal formula supplementation needs and given chart. Alimentum 20 calorie formula taken to mom for supplementation post BF if infant does not feed well. Reviewed keeping I/O records and when to know baby is getting enough. Mom and grandmother is very worried that infant is not getting enough, discussed feeding cues and normal feeding behaviors in NB including cluster feeds. Mom very anxious about feeds, support given. Will plan to f/u in am. Advised mom to call for assistance PRN.  Patient Name: Tina Sanchez Date: 09/29/2014 Reason for consult: Follow-up assessment   Maternal Data Has patient been taught Hand Expression?: Yes Does the patient have breastfeeding experience prior to this delivery?: No  Feeding Feeding Type: Breast Fed Length of feed: 15 min  LATCH Score/Interventions Latch: Grasps breast easily, tongue down, lips flanged, rhythmical sucking. Intervention(s): Adjust position;Assist with latch;Breast massage;Breast compression  Audible Swallowing: A few with stimulation Intervention(s): Skin to skin;Hand expression  Type of Nipple: Everted at rest and after stimulation  Comfort (Breast/Nipple): Soft / non-tender     Hold (Positioning): Assistance needed to correctly position infant at breast and maintain latch. Intervention(s): Breastfeeding basics reviewed;Support Pillows;Skin to skin  LATCH Score: 8  Lactation Tools Discussed/Used WIC Program: Yes Pump Review: Setup, frequency, and cleaning;Milk Storage Initiated by:: Noralee Stain, RN,  IBCLC Date initiated:: 09/29/14   Consult Status Consult Status: Follow-up Date: 09/30/14 Follow-up type: In-patient    Silas Flood Tina Sanchez 09/29/2014, 7:21 PM

## 2014-09-29 NOTE — Lactation Note (Signed)
This note was copied from the chart of Tina Paw Karstens. Lactation Consultation Note  Initial Lactation Consultation for G1 mom with 5 lb 3 oz baby at 7 hours old. Mom reports positive breast changes with pregnancy and taught to hand express, droplets of colostrum visible and easily expressible. Infant was STS with mom. Awakened to feed at breast. Infant awakened easily. She latched easily and nursed vigorously. Mom using breast compression and massage during feed. Intermitent audible swallows heard. Did need stimulation to suckle towards end of feeding. Mom was able to get infant latched independently in football and cradle hold. Discussed positioning and support pillows.  Discussed with mom and grandmother that infant is small and may need supplement at some point as well as needs decreased stimulation to be able to feed and maintain temp. Since infant nursed well we will reassess prn for supplement. Set mom up with DEBP to stimulate milk production. Mom pumped for 10 minutes and then said she did not think pumping was for her. Encouraged her to rest and try again later. Pump set up, cleaning, and milk storage presented. Encouraged STS, 8-12 feedings in 24 hours and need to awaken infant at 3 hours if she does not awaken to feed. Discussed that she may want and need to cluster feed also. [facility] handout given with phone #, mom made aware of OP LC services, Support Groups, and BF Resources. Told to call PRN. Plan to follow up 9/16.  Patient Name: Tina Sanchez [id number] Date: 09/29/2014 Reason for consult: Initial assessment;Infant < 6lbs   Maternal Data Has patient been taught Hand Expression?: Yes Does the patient have breastfeeding experience prior to this delivery?: No  Feeding Feeding Type: Breast Fed Length of feed: 15 min  LATCH Score/Interventions Latch: Grasps breast easily, tongue down, lips flanged, rhythmical sucking. Intervention(s): Adjust position;Assist with latch;Breast  massage;Breast compression  Audible Swallowing: Spontaneous and intermittent Intervention(s): Skin to skin;Hand expression  Type of Nipple: Everted at rest and after stimulation  Comfort (Breast/Nipple): Soft / non-tender     Hold (Positioning): Assistance needed to correctly position infant at breast and maintain latch.  LATCH Score: 9  Lactation Tools Discussed/Used WIC Program: Yes Pump Review: Setup, frequency, and cleaning;Milk Storage Initiated by:: Noralee Stain, RN, IBCLC Date initiated:: 09/29/14   Consult Status Consult Status: Follow-up Date: 09/30/14 Follow-up type: In-patient    Silas Flood Juanice Warburton 09/29/2014, 5:41 PM

## 2014-09-29 NOTE — Anesthesia Procedure Notes (Signed)
Epidural Patient location during procedure: OB  Preanesthetic Checklist Completed: patient identified, site marked, surgical consent, pre-op evaluation, timeout performed, IV checked, risks and benefits discussed and monitors and equipment checked  Epidural Patient position: sitting Prep: site prepped and draped and DuraPrep Patient monitoring: continuous pulse ox and blood pressure Approach: midline Injection technique: LOR air  Needle:  Needle type: Tuohy  Needle gauge: 17 G Needle length: 9 cm and 9 Needle insertion depth: 7 cm Catheter type: closed end flexible Catheter size: 19 Gauge Catheter at skin depth: 14 cm Test dose: negative  Assessment Events: blood not aspirated, injection not painful, no injection resistance, negative IV test and no paresthesia  Additional Notes Dosing of Epidural:  1st dose, through catheter ............................................Marland Kitchen  Xylocaine 40 mg  2nd dose, through catheter, after waiting 3 minutes........Marland KitchenXylocaine 60 mg    As each dose occurred, patient was free of IV sx; and patient exhibited no evidence of SA injection.  Patient is more comfortable after epidural dosed. Please see RN's note for documentation of vital signs,and FHR which are stable.  Patient reminded not to try to ambulate with numb legs, and that an RN must be present when she attempts to get up.

## 2014-09-29 NOTE — Progress Notes (Signed)
Labor Progress Note  S: Patient resting comfortably, does not feel contractions.  O:  BP 132/85 mmHg  Pulse 105  Temp(Src) 97.5 F (36.4 C) (Oral)  Resp 18  Ht  (1.626 m)  Wt 191 lb (86.637 kg)  BMI 32.77 kg/m2  SpO2 100%  LMP  (LMP Unknown) EFM: FHR: 145, moderate variability, +accels, variable decels CVE: 5/60/-2, vtx  A&P: 24 y.o. G1P0000 [redacted]w[redacted]d admitted with PROM. #Labor: Got cytotec for cervical ripening x1 and started contracting. After epidural, FHT showed repetitive deep variables and some late decels. Got terb and fetus recovered with 2 hr rest period. Placed IUPC ~0500 and will start pit #FWB: Category 2 tracing #GBS: pos-PCN  Durenda Hurt Resident Physician 5:27 AM   I have seen and examined this patient and I agree with the above. Cam Hai CNM 7:28 AM 09/29/2014

## 2014-09-29 NOTE — Anesthesia Preprocedure Evaluation (Signed)

## 2014-09-30 NOTE — Clinical Social Work Maternal (Signed)
CLINICAL SOCIAL WORK MATERNAL/CHILD NOTE  Patient Details  Name: Jerlisa Diliberto MRN: [id number] Date of Birth: 02/28/1990  Date:  Sep 26, 2014  Clinical Social Worker Initiating Note:  Loleta Books, LCSW Date/ Time Initiated:  09/30/14/1100     Child's Name:  Ivin Poot   Legal Guardian:  Jomarie Longs  FOB not listed on birth certificate, per MOB.   Need for Interpreter:  None   Date of Referral:  2015/01/09     Reason for Referral:  Current Substance Use/Substance Use During Pregnancy    Referral Source:  [facility]   Address:  [address] [place], [place] [address]  Phone number:  [phone]   Household Members:  Parents   Natural Supports (not living in the home):  Immediate Family, Friends   Professional Supports: None   Education:    N/A  Surveyor, quantity Resources:  Medicaid   Other Resources:  [facility]   Cultural/Religious Considerations Which May Impact Care:  None reported  Strengths:  Home prepared for child , Ability to meet basic needs    Risk Factors/Current Problems:  None   Cognitive State:  Able to Concentrate , Alert , Insightful , Linear Thinking , Goal Oriented    Mood/Affect:  Happy , Interested , Calm , Comfortable    CSW Assessment:  CSW received request for consult due to MOB presenting with a history of THC use during the pregnancy.  Prior to meeting with MOB, CSW completed chart review.  CSW notes that MOB presents with a negative UDS in April, June, and September.  CSW did not locate a positive drug screen, but it was reported that she had a positive drug screen in July.    MOB presented as easily engaged and receptive to the visit. She displayed a full range in affect and was noted to be in a pleasant mood.  CSW normalized and validated the MOB's feelings as she reflected upon her thoughts and feelings as she transitions postpartum. MOB reported that she is content with the birthing process and the infant's birth, but did reported  feeling nervous about caring for the infant due to the infant's size. She stated that she has interacted and cared for newborns previously, but shared that she has never needed to care for an infant "this small".  MOB recognized that other nursing staff have been demonstrated that the infant can "handle it", and she acknowledged that it will likely feel and become easier as the infant grows.  MOB endorsed strong support from her parents, sister, and friends (who also have children).  She stated that she had hoped that the FOB would be more involved, but she is attempting to let him "be". She stated that she is trying to let go of expectations related to his involvement, since she wants to want to focus on the infant and her feelings of happiness.  MOB denied mental health history, and denied perinatal mood and anxiety disorder symptoms during the pregnancy. MOB agreed to contact her medical provider if she notes onset of symptoms postpartum.   MOB reported recreational use of THC until she learned that she was pregnant. She stated that she did not engage in any [facility] use once she learned that she was pregnant in February. CSW inquired about documentation in medical record that highlights THC use in July.  MOB stated that she remembers her paperwork from her OB report marijuana use, but stated that she did not engage in any use this summer, and reported that no one  address the use with her.  MOB denied any questions or concerns related to the hospital drug screen policy, and denied concerns related to the collection of the infant's urine and meconium. She voiced strong belief that the infant's toxicology screens will be negative.   MOB denied additional questions, concerns, or needs at this time. She expressed appreciation for the visit, and agreed to contact CSW if needs arise.  CSW Plan/Description:   1)Patient/Family Education: Perinatal mood and anxiety disorders, hospital drug screen policy  2) CSW to  monitor infant's toxicology screens, and will make a CPS report if positive.  3)No Further Intervention Required/No Barriers to Discharge    Kelby Fam 2014/02/20, 12:42 PM

## 2014-09-30 NOTE — Progress Notes (Signed)
Post Partum Day 1 Subjective: no complaints, up ad lib, voiding and tolerating PO, small lochia, plans to bottle feed, nexplanon  Objective: Blood pressure 133/91, pulse 98, temperature 98 F (36.7 C), temperature source Oral, resp. rate 18, height  (1.626 m), weight 86.637 kg (191 lb), SpO2 100 %, unknown if currently breastfeeding.  Physical Exam:  General: alert, cooperative and no distress Lochia:normal flow Chest: CTAB Heart: RRR no m/r/g Abdomen: +BS, soft, nontender,  Uterine Fundus: firm DVT Evaluation: No evidence of DVT seen on physical exam. Extremities: trace edema   Recent Labs  09/28/14 1830  HGB 10.5*  HCT 31.1*    Assessment/Plan: Plan for discharge tomorrow   LOS: 2 days   CRESENZO-DISHMAN,FRANCES 09/30/2014, 8:02 AM

## 2014-09-30 NOTE — Anesthesia Postprocedure Evaluation (Signed)
  Anesthesia Post-op Note  Patient: Tina Sanchez  Procedure(s) Performed: * No procedures listed *  Patient Location: Mother/Baby  Anesthesia Type:Epidural  Level of Consciousness: awake, alert  and oriented  Airway and Oxygen Therapy: Patient Spontanous Breathing  Post-op Pain: none  Post-op Assessment: Post-op Vital signs reviewed and Patient's Cardiovascular Status Stable              Post-op Vital Signs: Reviewed and stable  Last Vitals:  Filed Vitals:   09/30/14 0537  BP: 133/91  Pulse: 98  Temp: 36.7 C  Resp: 18    Complications: No apparent anesthesia complications

## 2014-10-01 MED ORDER — IBUPROFEN 600 MG PO TABS: 600.0000 mg | ORAL_TABLET | Freq: Four times a day (QID) | ORAL | Status: DC | PRN

## 2014-10-01 NOTE — Discharge Instructions (Signed)
NO SEX UNTIL AFTER YOU GET YOUR BIRTH CONTROL   Call the office [phone]) or go to [facility] hospital for these signs of pre-eclampsia:  Severe headache that does not go away with Tylenol  Visual changes- seeing spots, double, blurred vision  Pain under your right breast or upper abdomen that does not go away with Tums or heartburn medicine  Nausea and/or vomiting  Severe swelling in your hands, feet, and face     Postpartum Care After Vaginal Delivery After you deliver your newborn (postpartum period), the usual stay in the hospital is 24-72 hours. If there were problems with your labor or delivery, or if you have other medical problems, you might be in the hospital longer.  While you are in the hospital, you will receive help and instructions on how to care for yourself and your newborn during the postpartum period.  While you are in the hospital: 6. Be sure to tell your nurses if you have pain or discomfort, as well as where you feel the pain and what makes the pain worse. 7. If you had an incision made near your vagina (episiotomy) or if you had some tearing during delivery, the nurses may put ice packs on your episiotomy or tear. The ice packs may help to reduce the pain and swelling. 8. If you are breastfeeding, you may feel uncomfortable contractions of your uterus for a couple of weeks. This is normal. The contractions help your uterus get back to normal size. 9. It is normal to have some bleeding after delivery. 1. For the first 1-3 days after delivery, the flow is red and the amount may be similar to a period. 2. It is common for the flow to start and stop. 3. In the first few days, you may pass some small clots. Let your nurses know if you begin to pass large clots or your flow increases. 4. Do not  flush blood clots down the toilet before having the nurse look at them. 5. During the next 3-10 days after delivery, your flow should become more watery and pink or brown-tinged in  color. 6. Ten to fourteen days after delivery, your flow should be a small amount of yellowish-white discharge. 7. The amount of your flow will decrease over the first few weeks after delivery. Your flow may stop in 6-8 weeks. Most women have had their flow stop by 12 weeks after delivery. 10. You should change your sanitary pads frequently. 11. Wash your hands thoroughly with soap and water for at least 20 seconds after changing pads, using the toilet, or before holding or feeding your newborn. 12. You should feel like you need to empty your bladder within the first 6-8 hours after delivery. 13. In case you become weak, lightheaded, or faint, call your nurse before you get out of bed for the first time and before you take a shower for the first time. 14. Within the first few days after delivery, your breasts may begin to feel tender and full. This is called engorgement. Breast tenderness usually goes away within 48-72 hours after engorgement occurs. You may also notice milk leaking from your breasts. If you are not breastfeeding, do not stimulate your breasts. Breast stimulation can make your breasts produce more milk. 15. Spending as much time as possible with your newborn is very important. During this time, you and your newborn can feel close and get to know each other. Having your newborn stay in your room (rooming in) will help to strengthen  the bond with your newborn. It will give you time to get to know your newborn and become comfortable caring for your newborn. 16. Your hormones change after delivery. Sometimes the hormone changes can temporarily cause you to feel sad or tearful. These feelings should not last more than a few days. If these feelings last longer than that, you should talk to your caregiver. 17. If desired, talk to your caregiver about methods of family planning or contraception. 18. Talk to your caregiver about immunizations. Your caregiver may want you to have the following  immunizations before leaving the hospital: 1. Tetanus, diphtheria, and pertussis (Tdap) or tetanus and diphtheria (Td) immunization. It is very important that you and your family (including grandparents) or others caring for your newborn are up-to-date with the Tdap or Td immunizations. The Tdap or Td immunization can help protect your newborn from getting ill. 2. Rubella immunization. 3. Varicella (chickenpox) immunization. 4. Influenza immunization. You should receive this annual immunization if you did not receive the immunization during your pregnancy. Document Released: 10/28/2006 Document Revised: 09/25/2011 Document Reviewed: 08/28/2011 [facility] Patient Information 2015 [place], [place]. This information is not intended to replace advice given to you by your health care provider. Make sure you discuss any questions you have with your health care provider.  Breastfeeding Deciding to breastfeed is one of the best choices you can make for you and your baby. A change in hormones during pregnancy causes your breast tissue to grow and increases the number and size of your milk ducts. These hormones also allow proteins, sugars, and fats from your blood supply to make breast milk in your milk-producing glands. Hormones prevent breast milk from being released before your baby is born as well as prompt milk flow after birth. Once breastfeeding has begun, thoughts of your baby, as well as his or her sucking or crying, can stimulate the release of milk from your milk-producing glands.  BENEFITS OF BREASTFEEDING For Your Baby 19. Your first milk (colostrum) helps your baby's digestive system function better.  20. There are antibodies in your milk that help your baby fight off infections.  21. Your baby has a lower incidence of asthma, allergies, and sudden infant death syndrome.  22. The nutrients in breast milk are better for your baby than infant formulas and are designed uniquely for your baby's needs.   23. Breast milk improves your baby's brain development.  24. Your baby is less likely to develop other conditions, such as childhood obesity, asthma, or type 2 diabetes mellitus.  For You   Breastfeeding helps to create a very special bond between you and your baby.   Breastfeeding is convenient. Breast milk is always available at the correct temperature and costs nothing.   Breastfeeding helps to burn calories and helps you lose the weight gained during pregnancy.   Breastfeeding makes your uterus contract to its prepregnancy size faster and slows bleeding (lochia) after you give birth.   Breastfeeding helps to lower your risk of developing type 2 diabetes mellitus, osteoporosis, and breast or ovarian cancer later in life. SIGNS THAT YOUR BABY IS HUNGRY Early Signs of Hunger  Increased alertness or activity.  Stretching.  Movement of the head from side to side.  Movement of the head and opening of the mouth when the corner of the mouth or cheek is stroked (rooting).  Increased sucking sounds, smacking lips, cooing, sighing, or squeaking.  Hand-to-mouth movements.  Increased sucking of fingers or hands. Late Signs of Hunger  Fussing.  Intermittent crying. Extreme Signs of Hunger Signs of extreme hunger will require calming and consoling before your baby will be able to breastfeed successfully. Do not wait for the following signs of extreme hunger to occur before you initiate breastfeeding:   Restlessness.  A loud, strong cry.   Screaming. BREASTFEEDING BASICS Breastfeeding Initiation  Find a comfortable place to sit or lie down, with your neck and back well supported.  Place a pillow or rolled up blanket under your baby to bring him or her to the level of your breast (if you are seated). Nursing pillows are specially designed to help support your arms and your baby while you breastfeed.  Make sure that your baby's abdomen is facing your abdomen.   Gently  massage your breast. With your fingertips, massage from your chest wall toward your nipple in a circular motion. This encourages milk flow. You may need to continue this action during the feeding if your milk flows slowly.  Support your breast with 4 fingers underneath and your thumb above your nipple. Make sure your fingers are well away from your nipple and your baby's mouth.   Stroke your baby's lips gently with your finger or nipple.   When your baby's mouth is open wide enough, quickly bring your baby to your breast, placing your entire nipple and as much of the colored area around your nipple (areola) as possible into your baby's mouth.   More areola should be visible above your baby's upper lip than below the lower lip.   Your baby's tongue should be between his or her lower gum and your breast.   Ensure that your baby's mouth is correctly positioned around your nipple (latched). Your baby's lips should create a seal on your breast and be turned out (everted).  It is common for your baby to suck about 2-3 minutes in order to start the flow of breast milk. Latching Teaching your baby how to latch on to your breast properly is very important. An improper latch can cause nipple pain and decreased milk supply for you and poor weight gain in your baby. Also, if your baby is not latched onto your nipple properly, he or she may swallow some air during feeding. This can make your baby fussy. Burping your baby when you switch breasts during the feeding can help to get rid of the air. However, teaching your baby to latch on properly is still the best way to prevent fussiness from swallowing air while breastfeeding. Signs that your baby has successfully latched on to your nipple:    Silent tugging or silent sucking, without causing you pain.   Swallowing heard between every 3-4 sucks.    Muscle movement above and in front of his or her ears while sucking.  Signs that your baby has not  successfully latched on to nipple:   Sucking sounds or smacking sounds from your baby while breastfeeding.  Nipple pain. If you think your baby has not latched on correctly, slip your finger into the corner of your baby's mouth to break the suction and place it between your baby's gums. Attempt breastfeeding initiation again. Signs of Successful Breastfeeding Signs from your baby:   A gradual decrease in the number of sucks or complete cessation of sucking.   Falling asleep.   Relaxation of his or her body.   Retention of a small amount of milk in his or her mouth.   Letting go of your breast by himself or herself. Signs from you:  Breasts that have increased in firmness, weight, and size 1-3 hours after feeding.   Breasts that are softer immediately after breastfeeding.  Increased milk volume, as well as a change in milk consistency and color by the fifth day of breastfeeding.   Nipples that are not sore, cracked, or bleeding. Signs That Your Pecola Leisure is Getting Enough Milk  Wetting at least 3 diapers in a 24-hour period. The urine should be clear and pale yellow by age 81 days.  At least 3 stools in a 24-hour period by age 81 days. The stool should be soft and yellow.  At least 3 stools in a 24-hour period by age 22 days. The stool should be seedy and yellow.  No loss of weight greater than 10% of birth weight during the first 44 days of age.  Average weight gain of 4-7 ounces (113-198 g) per week after age 497 days.  Consistent daily weight gain by age 81 days, without weight loss after the age of 2 weeks. After a feeding, your baby may spit up a small amount. This is common. BREASTFEEDING FREQUENCY AND DURATION Frequent feeding will help you make more milk and can prevent sore nipples and breast engorgement. Breastfeed when you feel the need to reduce the fullness of your breasts or when your baby shows signs of hunger. This is called "breastfeeding on demand." Avoid  introducing a pacifier to your baby while you are working to establish breastfeeding (the first 4-6 weeks after your baby is born). After this time you may choose to use a pacifier. Research has shown that pacifier use during the first year of a baby's life decreases the risk of sudden infant death syndrome (SIDS). Allow your baby to feed on each breast as long as he or she wants. Breastfeed until your baby is finished feeding. When your baby unlatches or falls asleep while feeding from the first breast, offer the second breast. Because newborns are often sleepy in the first few weeks of life, you may need to awaken your baby to get him or her to feed. Breastfeeding times will vary from baby to baby. However, the following rules can serve as a guide to help you ensure that your baby is properly fed:  Newborns (babies 15 weeks of age or younger) may breastfeed every 1-3 hours.  Newborns should not go longer than 3 hours during the day or 5 hours during the night without breastfeeding.  You should breastfeed your baby a minimum of 8 times in a 24-hour period until you begin to introduce solid foods to your baby at around 33 months of age. BREAST MILK PUMPING Pumping and storing breast milk allows you to ensure that your baby is exclusively fed your breast milk, even at times when you are unable to breastfeed. This is especially important if you are going back to work while you are still breastfeeding or when you are not able to be present during feedings. Your lactation consultant can give you guidelines on how long it is safe to store breast milk.  A breast pump is a machine that allows you to pump milk from your breast into a sterile bottle. The pumped breast milk can then be stored in a refrigerator or freezer. Some breast pumps are operated by hand, while others use electricity. Ask your lactation consultant which type will work best for you. Breast pumps can be purchased, but some hospitals and  breastfeeding support groups lease breast pumps on a monthly basis. A lactation consultant  can teach you how to hand express breast milk, if you prefer not to use a pump.  CARING FOR YOUR BREASTS WHILE YOU BREASTFEED Nipples can become dry, cracked, and sore while breastfeeding. The following recommendations can help keep your breasts moisturized and healthy:  Avoid using soap on your nipples.   Wear a supportive bra. Although not required, special nursing bras and tank tops are designed to allow access to your breasts for breastfeeding without taking off your entire bra or top. Avoid wearing underwire-style bras or extremely tight bras.  Air dry your nipples for 3-48minutes after each feeding.   Use only cotton bra pads to absorb leaked breast milk. Leaking of breast milk between feedings is normal.   Use lanolin on your nipples after breastfeeding. Lanolin helps to maintain your skin's normal moisture barrier. If you use pure lanolin, you do not need to wash it off before feeding your baby again. Pure lanolin is not toxic to your baby. You may also hand express a few drops of breast milk and gently massage that milk into your nipples and allow the milk to air dry. In the first few weeks after giving birth, some women experience extremely full breasts (engorgement). Engorgement can make your breasts feel heavy, warm, and tender to the touch. Engorgement peaks within 3-5 days after you give birth. The following recommendations can help ease engorgement:  Completely empty your breasts while breastfeeding or pumping. You may want to start by applying warm, moist heat (in the shower or with warm water-soaked hand towels) just before feeding or pumping. This increases circulation and helps the milk flow. If your baby does not completely empty your breasts while breastfeeding, pump any extra milk after he or she is finished.  Wear a snug bra (nursing or regular) or tank top for 1-2 days to signal your  body to slightly decrease milk production.  Apply ice packs to your breasts, unless this is too uncomfortable for you.  Make sure that your baby is latched on and positioned properly while breastfeeding. If engorgement persists after 48 hours of following these recommendations, contact your health care provider or a Advertising copywriter. OVERALL HEALTH CARE RECOMMENDATIONS WHILE BREASTFEEDING  Eat healthy foods. Alternate between meals and snacks, eating 3 of each per day. Because what you eat affects your breast milk, some of the foods may make your baby more irritable than usual. Avoid eating these foods if you are sure that they are negatively affecting your baby.  Drink milk, fruit juice, and water to satisfy your thirst (about 10 glasses a day).   Rest often, relax, and continue to take your prenatal vitamins to prevent fatigue, stress, and anemia.  Continue breast self-awareness checks.  Avoid chewing and smoking tobacco.  Avoid alcohol and drug use. Some medicines that may be harmful to your baby can pass through breast milk. It is important to ask your health care provider before taking any medicine, including all over-the-counter and prescription medicine as well as vitamin and herbal supplements. It is possible to become pregnant while breastfeeding. If birth control is desired, ask your health care provider about options that will be safe for your baby. SEEK MEDICAL CARE IF:   You feel like you want to stop breastfeeding or have become frustrated with breastfeeding.  You have painful breasts or nipples.  Your nipples are cracked or bleeding.  Your breasts are red, tender, or warm.  You have a swollen area on either breast.  You have a fever  or chills.  You have nausea or vomiting.  You have drainage other than breast milk from your nipples.  Your breasts do not become full before feedings by the fifth day after you give birth.  You feel sad and depressed.  Your  baby is too sleepy to eat well.  Your baby is having trouble sleeping.   Your baby is wetting less than 3 diapers in a 24-hour period.  Your baby has less than 3 stools in a 24-hour period.  Your baby's skin or the white part of his or her eyes becomes yellow.   Your baby is not gaining weight by 29 days of age. SEEK IMMEDIATE MEDICAL CARE IF:   Your baby is overly tired (lethargic) and does not want to wake up and feed.  Your baby develops an unexplained fever. Document Released: 12/31/2004 Document Revised: 01/05/2013 Document Reviewed: 06/24/2012 [facility] Patient Information 2015 [place], [place]. This information is not intended to replace advice given to you by your health care provider. Make sure you discuss any questions you have with your health care provider.

## 2014-10-01 NOTE — Discharge Summary (Signed)
Obstetric Discharge Summary Reason for Admission: rupture of membranes, noted to have elevated bp on admission Prenatal Procedures: ultrasound Intrapartum Procedures: spontaneous vaginal delivery and GBS prophylaxis Postpartum Procedures: none Complications-Operative and Postpartum: none Eating, drinking, voiding, ambulating well.  +flatus.  Lochia and pain wnl.  Denies dizziness, lightheadedness, or sob. No complaints. Denies ha, scotomata, ruq/epigastric pain, n/v.    Hospital Course: Tina Sanchez is a 24 y.o. G65P1001 female admited at 38.5wks for PROM, noted to have elevated bp's on admission, pre-e labs neg. Received cytotec and foley bulb, then pitocin. She progressed to uncomplicated NSVB. Her pp course has been uncomplicated.  By PPD#2 she is doing well and is deemed to have received the full benefit of her hospital stay.  Filed Vitals:   10/01/14 0526  BP: 144/89  Pulse: 84  Temp: 97.9 F (36.6 C)  Resp: 18   Temp:  [97.9 F (36.6 C)-98.5 F (36.9 C)] 97.9 F (36.6 C) (09/17 0526) Pulse Rate:  [84-90] 84 (09/17 0526) Resp:  [18] 18 (09/17 0526) BP: (127-144)/(83-89) 144/89 mmHg (09/17 0526) SpO2:  [100 %] 100 % (09/16 2015)   H/H: Lab Results  Component Value Date/Time   HGB 10.5* 09/28/2014 06:30 PM   HGB 11.0* 07/11/2011 11:47 AM   HCT 31.1* 09/28/2014 06:30 PM   HCT 32.0* 07/14/2014 08:47 AM   HCT 33.1* 07/11/2011 11:47 AM    Physical Exam: General: alert, cooperative and no distress Abdomen/Uterine Fundus: Appropriately tender, non-distended, FF @ U-2 Incision: n/a Lochia: appropriate Extremities: No evidence of DVT seen on physical exam. Negative Homan's sign, no cords, calf tenderness, or significant calf/ankle edema  DTRs 2+, no clonus  Discharge Diagnoses: Term Pregnancy-delivered  Discharge Information: Date: 07/26/2010 Activity: pelvic rest Diet: routine  Medications: PNV and Ibuprofen Breast feeding: Yes and bottle Contraception: abstinence  until nexplanon Circumcision: n/a Condition: stable Instructions: refer to handout Discharge to: home  Infant: Home with mother  Follow-up Information    Follow up with [facility] In 1 week.   Specialty:  Obstetrics and Gynecology   Why:  for blood pressure check, then 4-6 weeks for postpartum visit   Contact information:   [address] [place] [address] [phone]      Marge Duncans, CNM, WHNP-BC 10/01/2014,10:10 AM

## 2014-10-07 ENCOUNTER — Encounter: Payer: Medicaid Other | Admitting: Women's Health

## 2014-11-10 ENCOUNTER — Ambulatory Visit (INDEPENDENT_AMBULATORY_CARE_PROVIDER_SITE_OTHER): Payer: Medicaid Other | Admitting: Advanced Practice Midwife

## 2014-11-10 ENCOUNTER — Encounter: Payer: Self-pay | Admitting: Advanced Practice Midwife

## 2014-11-10 NOTE — Progress Notes (Signed)
Tina Sanchez is a 24 y.o. who presents for a postpartum visit. She is 7 weeks postpartum following a spontaneous vaginal delivery. I have fully reviewed the prenatal and intrapartum course. The delivery was at 38 gestational weeks.  Anesthesia: epidural. Postpartum course has been uneventful. Baby's course has been uneventful. Baby is feeding by bottle. Bleeding: no bleeding. Bowel function is normal. Bladder function is normal. Patient is not sexually active. Contraception method is none. Postpartum depression screening: 16, but feels more annoyed than anything. Plans on going back to work at some point. Denies SI/HI. Declines meds/counseling.   Current outpatient prescriptions:  .  ibuprofen (ADVIL,MOTRIN) 600 MG tablet, Take 1 tablet (600 mg total) by mouth every 6 (six) hours as needed for mild pain, moderate pain or cramping. (Patient not taking: Reported on 11/10/2014), Disp: 30 tablet, Rfl: 0 .  Prenatal Vit-Fe Fumarate-FA (PRENATAL MULTIVITAMIN) TABS tablet, Take 1 tablet by mouth daily at 12 noon., Disp: , Rfl:   Review of Systems   Constitutional: Negative for fever and chills Eyes: Negative for visual disturbances Respiratory: Negative for shortness of breath, dyspnea Cardiovascular: Negative for chest pain or palpitations  Gastrointestinal: Negative for vomiting, diarrhea and constipation Genitourinary: Negative for dysuria and urgency Musculoskeletal: Negative for back pain, joint pain, myalgias  Neurological: Negative for dizziness and headaches   Objective:     Filed Vitals:   11/10/14 1432  BP: 110/80  Pulse: 80   General:  alert, cooperative and no distress   Breasts:  negative  Lungs: clear to auscultation bilaterally  Heart:  regular rate and rhythm  Abdomen: Soft, nontender   Vulva:  normal  Vagina: normal vagina  Cervix:  closed  Corpus: Well involuted     Rectal Exam: no hemorrhoids        Assessment:    normal postpartum exam.  Plan:    1.  Contraception: Nexplanon 2. Follow up in: 3 weeks For Nexplanon or as needed.

## 2014-12-01 ENCOUNTER — Encounter: Payer: Self-pay | Admitting: Women's Health

## 2014-12-01 ENCOUNTER — Ambulatory Visit (INDEPENDENT_AMBULATORY_CARE_PROVIDER_SITE_OTHER): Payer: Medicaid Other | Admitting: Women's Health

## 2014-12-01 VITALS — BP 122/84 | HR 76 | Wt 173.0 lb

## 2014-12-01 DIAGNOSIS — Z30017 Encounter for initial prescription of implantable subdermal contraceptive: Secondary | ICD-10-CM | POA: Insufficient documentation

## 2014-12-01 DIAGNOSIS — Z3202 Encounter for pregnancy test, result negative: Secondary | ICD-10-CM

## 2014-12-01 LAB — POCT URINE PREGNANCY: PREG TEST UR: NEGATIVE

## 2014-12-01 NOTE — Progress Notes (Signed)
Patient ID: Tina LongsRebekkah Sanchez, female   DOB: 08/17/1990, 24 y.o.   MRN: [id number] Tina Sanchez is a 24 y.o. year old African American female here for Nexplanon insertion.  Patient's last menstrual period was 12/01/2014., last sexual intercourse was >3wks ago, and her pregnancy test today was negative.  Risks/benefits/side effects of Nexplanon have been discussed and her questions have been answered.  Specifically, a failure rate of 01/998 has been reported, with an increased failure rate if pt takes St. John's Wort and/or antiseizure medicaitons.  Tina Sanchez is aware of the common side effect of irregular bleeding, which the incidence of decreases over time.  BP 122/84 mmHg  Pulse 76  Wt 173 lb (78.472 kg)  LMP 12/01/2014  Breastfeeding? No  Results for orders placed or performed in visit on 12/01/14 (from the past 24 hour(s))  POCT urine pregnancy   Collection Time: 12/01/14 10:25 AM  Result Value Ref Range   Preg Test, Ur Negative Negative     She is right-handed, so her left arm, approximately 4 inches proximal from the elbow, was cleansed with alcohol and anesthetized with 2cc of 2% Lidocaine.  The area was cleansed again with betadine and the Nexplanon was inserted per manufacturer's recommendations without difficulty.  A steri-strip and pressure bandage were applied.  Pt was instructed to keep the area clean and dry, remove pressure bandage in 24 hours, and keep insertion site covered with the steri-strip for 3-5 days.  Back up contraception was recommended for 2 weeks.  She was given a card indicating date Nexplanon was inserted and date it needs to be removed. Follow-up PRN problems.  Marge DuncansBooker, Bleu Moisan Randall CNM, [facility] 12/01/2014 11:00 AM

## 2014-12-01 NOTE — Patient Instructions (Signed)

## 2015-06-02 ENCOUNTER — Encounter (HOSPITAL_COMMUNITY): Payer: Self-pay | Admitting: *Deleted

## 2015-06-02 ENCOUNTER — Emergency Department (HOSPITAL_COMMUNITY)
Admission: EM | Admit: 2015-06-02 | Discharge: 2015-06-02 | Disposition: A | Discharge status: Home / Self Care | Payer: 59 | Attending: Emergency Medicine | Admitting: Emergency Medicine

## 2015-06-02 DIAGNOSIS — H6092 Unspecified otitis externa, left ear: Secondary | ICD-10-CM | POA: Insufficient documentation

## 2015-06-02 DIAGNOSIS — J3489 Other specified disorders of nose and nasal sinuses: Secondary | ICD-10-CM | POA: Insufficient documentation

## 2015-06-02 DIAGNOSIS — Z87891 Personal history of nicotine dependence: Secondary | ICD-10-CM | POA: Diagnosis not present

## 2015-06-02 DIAGNOSIS — R0981 Nasal congestion: Secondary | ICD-10-CM | POA: Insufficient documentation

## 2015-06-02 DIAGNOSIS — H9202 Otalgia, left ear: Secondary | ICD-10-CM | POA: Diagnosis present

## 2015-06-02 MED ORDER — NEOMYCIN-POLYMYXIN-HC 3.5-10000-1 OT SUSP: 4.0000 [drp] | Freq: Three times a day (TID) | OTIC | Status: AC

## 2015-06-02 MED ORDER — IBUPROFEN 600 MG PO TABS: 600.0000 mg | ORAL_TABLET | Freq: Four times a day (QID) | ORAL | Status: DC | PRN

## 2015-06-02 MED ORDER — AMOXICILLIN 500 MG PO CAPS: 500.0000 mg | ORAL_CAPSULE | Freq: Three times a day (TID) | ORAL | Status: AC

## 2015-06-02 NOTE — Discharge Instructions (Signed)
Ear Drops, Adult °You have been diagnosed with a condition requiring you to put drops of medicine into your outer ear. °HOME CARE INSTRUCTIONS  °· Put drops in the affected ear as instructed. After putting the drops in, you will need to lie down with the affected ear facing up for ten minutes so the drops will remain in the ear canal and run down and fill the canal. Continue using the ear drops for as long as directed by your health care provider. °· Prior to getting up, put a cotton ball gently in your ear canal. Leave enough of the cotton ball out so it can be easily removed. Do not attempt to push this down into the canal with a cotton-tipped swab or other instrument. °· Do not irrigate or wash out your ears if you have had a perforated eardrum or mastoid surgery, or unless instructed to do so by your health care provider. °· Keep appointments with your health care provider as instructed. °· Finish all medicine, or use for the length of time prescribed by your health care provider. Continue the drops even if your problem seems to be doing well after a couple days, or continue as instructed. °SEEK MEDICAL CARE IF: °· You become worse or develop increasing pain. °· You notice any unusual drainage from your ear (particularly if the drainage has a bad smell). °· You develop hearing difficulties. °· You experience a serious form of dizziness in which you feel as if the room is spinning, and you feel nauseated (vertigo). °· The outside of your ear becomes red or swollen or both. This may be a sign of an allergic reaction. °MAKE SURE YOU:  °· Understand these instructions. °· Will watch your condition. °· Will get help right away if you are not doing well or get worse. °  °This information is not intended to replace advice given to you by your health care provider. Make sure you discuss any questions you have with your health care provider. °  °Document Released: 12/25/2000 Document Revised: 01/21/2014 Document  Reviewed: 07/28/2012 °Elsevier Interactive Patient Education ©2016 Elsevier Inc. ° °Otitis Externa °Otitis externa is a bacterial or fungal infection of the outer ear canal. This is the area from the eardrum to the outside of the ear. Otitis externa is sometimes called "swimmer's ear." °CAUSES  °Possible causes of infection include: °· Swimming in dirty water. °· Moisture remaining in the ear after swimming or bathing. °· Mild injury (trauma) to the ear. °· Objects stuck in the ear (foreign body). °· Cuts or scrapes (abrasions) on the outside of the ear. °SIGNS AND SYMPTOMS  °The first symptom of infection is often itching in the ear canal. Later signs and symptoms may include swelling and redness of the ear canal, ear pain, and yellowish-white fluid (pus) coming from the ear. The ear pain may be worse when pulling on the earlobe. °DIAGNOSIS  °Your health care provider will perform a physical exam. A sample of fluid may be taken from the ear and examined for bacteria or fungi. °TREATMENT  °Antibiotic ear drops are often given for 10 to 14 days. Treatment may also include pain medicine or corticosteroids to reduce itching and swelling. °HOME CARE INSTRUCTIONS  °· Apply antibiotic ear drops to the ear canal as prescribed by your health care provider. °· Take medicines only as directed by your health care provider. °· If you have diabetes, follow any additional treatment instructions from your health care provider. °· Keep all follow-up visits   as directed by your health care provider. PREVENTION   Keep your ear dry. Use the corner of a towel to absorb water out of the ear canal after swimming or bathing.  Avoid scratching or putting objects inside your ear. This can damage the ear canal or remove the protective wax that lines the canal. This makes it easier for bacteria and fungi to grow.  Avoid swimming in lakes, polluted water, or poorly chlorinated pools.  You may use ear drops made of rubbing alcohol and  vinegar after swimming. Combine equal parts of white vinegar and alcohol in a bottle. Put 3 or 4 drops into each ear after swimming. SEEK MEDICAL CARE IF:   You have a fever.  Your ear is still red, swollen, painful, or draining pus after 3 days.  Your redness, swelling, or pain gets worse.  You have a severe headache.  You have redness, swelling, pain, or tenderness in the area behind your ear. MAKE SURE YOU:   Understand these instructions.  Will watch your condition.  Will get help right away if you are not doing well or get worse.   This information is not intended to replace advice given to you by your health care provider. Make sure you discuss any questions you have with your health care provider.   Document Released: 12/31/2004 Document Revised: 01/21/2014 Document Reviewed: 01/17/2011 Elsevier Interactive Patient Education [employer]2016 Elsevier Inc.    As discussed, you are being treated for both an external and an internal ear infection since I am unable to visualize your ear drum at this time - I suspect the drainage in your canal may have come from an inner ear infection which may have ruptured your ear drum, allowing the infection to drain out.  Use motrin for pain relief.

## 2015-06-02 NOTE — ED Notes (Signed)
Pt comes in with left ear pain and nasal drainage starting on Monday. Pt states she began having having dizziness with movement starting today. Pt denies any n/v/d.

## 2015-06-04 NOTE — ED Provider Notes (Signed)
CSN: [id number]     Arrival date & time 06/02/15  1714 History   First MD Initiated Contact with Patient 06/02/15 1741     Chief Complaint  Patient presents with  . Otalgia     (Consider location/radiation/quality/duration/timing/severity/associated sxs/prior Treatment) The history is provided by the patient.   Tina Sanchez is a 25 y.o. female presenting with left ear pain along with clear nasal discharge, nasal congestion for the past 5 days. She denies drainage from her ear. She began having transient unsteadiness sensation with positional changes which started today. She denies lightheadedness and denies spinning sensation. Also has had no fevers, chills, nausea, headaches, neck pain or stiffness.  She has placed an otc ear drop (to dry the ear) without relief.     Past Medical History  Diagnosis Date  . Medical history non-contributory   . HSV-2 (herpes simplex virus 2) infection    Past Surgical History  Procedure Laterality Date  . No past surgeries     Family History  Problem Relation Age of Onset  . Cancer Paternal Uncle     prostate  . Hypertension Maternal Grandmother   . Cancer Maternal Grandfather     brain  . Diabetes Paternal Grandfather    Social History  Substance Use Topics  . Smoking status: Former Games developer  . Smokeless tobacco: Never Used  . Alcohol Use: Yes     Comment: occasionally   OB History    Gravida Para Term Preterm AB TAB SAB Ectopic Multiple Living   0 0 0 0 0 0 1     Review of Systems  Constitutional: Negative for fever and chills.  HENT: Positive for congestion, ear pain and rhinorrhea. Negative for ear discharge, hearing loss, sinus pressure, sore throat, trouble swallowing and voice change.   Eyes: Negative for discharge.  Respiratory: Negative for cough, shortness of breath, wheezing and stridor.   Cardiovascular: Negative for chest pain.  Gastrointestinal: Negative for nausea and vomiting.  Genitourinary: Negative.    Neurological: Positive for dizziness. Negative for light-headedness.      Allergies  Review of patient's allergies indicates no known allergies.  Home Medications   Prior to Admission medications   Medication Sig Start Date End Date Taking? Authorizing Provider  amoxicillin (AMOXIL) 500 MG capsule Take 1 capsule (500 mg total) by mouth 3 (three) times daily. 06/02/15 06/12/15  Burgess Amor, PA-C  ibuprofen (ADVIL,MOTRIN) 600 MG tablet Take 1 tablet (600 mg total) by mouth every 6 (six) hours as needed. 06/02/15   Burgess Amor, PA-C  neomycin-polymyxin-hydrocortisone (CORTISPORIN) 3.5-10000-1 otic suspension Place 4 drops into the left ear 3 (three) times daily. 06/02/15 06/08/15  Burgess Amor, PA-C  Prenatal Vit-Fe Fumarate-FA (PRENATAL MULTIVITAMIN) TABS tablet Take 1 tablet by mouth daily at 12 noon.    Historical Provider, MD   BP 137/85 mmHg  Pulse 107  Temp(Src) 98.7 F (37.1 C) (Oral)  Resp 16  Ht  (1.651 m)  Wt 72.576 kg  BMI 26.63 kg/m2  SpO2 97%  LMP 05/23/2015 Physical Exam  Constitutional: She is oriented to person, place, and time. She appears well-developed and well-nourished.  HENT:  Head: Normocephalic and atraumatic.  Right Ear: Tympanic membrane and ear canal normal.  Nose: Mucosal edema and rhinorrhea present.  Mouth/Throat: Uvula is midline, oropharynx is clear and moist and mucous membranes are normal. No oropharyngeal exudate, posterior oropharyngeal edema, posterior oropharyngeal erythema or tonsillar abscesses.  Green purulence noted in left TM deep, lying in  front of TM.  No external canal edema.  Unable to visualize TM.  Eyes: Conjunctivae are normal.  Cardiovascular: Normal rate and normal heart sounds.   Pulmonary/Chest: Effort normal. No respiratory distress. She has no wheezes. She has no rales.  Abdominal: Soft. There is no tenderness.  Musculoskeletal: Normal range of motion.  Neurological: She is alert and oriented to person, place, and time.   Skin: Skin is warm and dry. No rash noted.  Psychiatric: She has a normal mood and affect.    ED Course  Procedures (including critical care time) Labs Review Labs Reviewed - No data to display  Imaging Review No results found. I have personally reviewed and evaluated these images and lab results as part of my medical decision-making.   EKG Interpretation None      MDM   Final diagnoses:  Otitis externa, left    Suspect otitis externa, but unable to visualize TM, possible otitis media with rupture - will cover for both including amoxil, and cortisporin gtts.  Ibuprofen for pain.  F/u with pcp if sx persist or worsen.    Burgess AmorJulie Jamelle Noy, PA-C 06/04/15 1316  Raeford RazorStephen Kohut, MD 06/08/15 [phone]

## 2015-12-28 ENCOUNTER — Other Ambulatory Visit: Payer: 59 | Admitting: Obstetrics and Gynecology

## 2015-12-28 ENCOUNTER — Encounter: Payer: Self-pay | Admitting: *Deleted

## 2016-06-28 ENCOUNTER — Encounter: Payer: 59 | Admitting: Obstetrics and Gynecology

## 2016-10-31 ENCOUNTER — Encounter (HOSPITAL_COMMUNITY): Payer: Self-pay

## 2016-10-31 ENCOUNTER — Emergency Department (HOSPITAL_COMMUNITY)
Admission: EM | Admit: 2016-10-31 | Discharge: 2016-10-31 | Disposition: A | Discharge status: Home / Self Care | Payer: 59 | Attending: Emergency Medicine | Admitting: Emergency Medicine

## 2016-10-31 DIAGNOSIS — H9202 Otalgia, left ear: Secondary | ICD-10-CM | POA: Diagnosis present

## 2016-10-31 DIAGNOSIS — H66002 Acute suppurative otitis media without spontaneous rupture of ear drum, left ear: Secondary | ICD-10-CM | POA: Insufficient documentation

## 2016-10-31 DIAGNOSIS — Z87891 Personal history of nicotine dependence: Secondary | ICD-10-CM | POA: Insufficient documentation

## 2016-10-31 MED ORDER — AMOXICILLIN 500 MG PO CAPS: 1000.0000 mg | ORAL_CAPSULE | Freq: Two times a day (BID) | ORAL | 0 refills | Status: DC

## 2016-10-31 MED ORDER — NEOMYCIN-POLYMYXIN-HC 3.5-10000-1 OT SUSP: 3.0000 [drp] | Freq: Three times a day (TID) | OTIC | 0 refills | Status: DC

## 2016-10-31 MED ORDER — AMOXICILLIN 250 MG PO CAPS
1000.0000 mg | ORAL_CAPSULE | Freq: Once | ORAL | Status: AC
  Administered 2016-10-31: 1000 mg via ORAL
  Filled 2016-10-31: qty 4

## 2016-10-31 NOTE — ED Provider Notes (Signed)
[facility]PART[facility] Provider Note   CSN: [id number] Arrival date & time: 10/31/16  [id number]     History   Chief Complaint Chief Complaint  Patient presents with  . Otalgia    HPI Tina Sanchez is a 26 y.o. female.  The history is provided by the patient.  Otalgia   She complains of drainage from her left ear for the last 2 weeks. There is occasionally some blood in the drainage. She denies pain, but has noted decreased hearing. She has a history of frequent ear infections. She denies any recent trauma. She denies fever or chills. She denies nasal congestion or throat pain.  Past Medical History:  Diagnosis Date  . HSV-2 (herpes simplex virus 2) infection   . Medical history non-contributory     Patient Active Problem List   Diagnosis Date Noted  . Nexplanon insertion 12/01/2014  . HSV-2 (herpes simplex virus 2) infection   . Trichomonal vaginitis during pregnancy in second trimester 04/27/2014  . Chlamydia infection affecting pregnancy in second trimester, antepartum 04/27/2014  . Susceptible to varicella (non-immune), currently pregnant 04/26/2014  . Marijuana use 04/26/2014    Past Surgical History:  Procedure Laterality Date  . NO PAST SURGERIES      OB History    Gravida Para Term Preterm AB Living   1 1 1  0 0 1   SAB TAB Ectopic Multiple Live Births   0 0 0 0 1       Home Medications    Prior to Admission medications   Medication Sig Start Date End Date Taking? Authorizing Provider  amoxicillin (AMOXIL) 500 MG capsule Take 2 capsules (1,000 mg total) by mouth 2 (two) times daily. 10/31/16   Dione BoozeGlick, Sonam Huelsmann, MD  ibuprofen (ADVIL,MOTRIN) 600 MG tablet Take 1 tablet (600 mg total) by mouth every 6 (six) hours as needed. 06/02/15   Burgess AmorIdol, Julie, PA-C  neomycin-polymyxin-hydrocortisone (CORTISPORIN) 3.5-10000-1 OTIC suspension Place 3 drops into the left ear 3 (three) times daily. 10/31/16   Dione BoozeGlick, Sidnie Swalley, MD  Prenatal Vit-Fe Fumarate-FA (PRENATAL  MULTIVITAMIN) TABS tablet Take 1 tablet by mouth daily at 12 noon.    [provider]    Family History Family History  Problem Relation Age of Onset  . Hypertension Maternal Grandmother   . Cancer Maternal Grandfather        brain  . Diabetes Paternal Grandfather   . Cancer Paternal Uncle        prostate    Social History Social History  Substance Use Topics  . Smoking status: Former Games developermoker  . Smokeless tobacco: Never Used  . Alcohol use Yes     Comment: occasionally     Allergies   Patient has no known allergies.   Review of Systems Review of Systems  HENT: Positive for ear pain.   All other systems reviewed and are negative.    Physical Exam Updated Vital Signs BP 131/82 (BP Location: Left Arm)   Pulse 88   Temp 98.2 F (36.8 C) (Oral)   Resp 17   SpO2 100%   Physical Exam  Nursing note and vitals reviewed.  26 year old female, resting comfortably and in no acute distress. Vital signs are normal. Oxygen saturation is 100%, which is normal. Head is normocephalic and atraumatic. PERRLA, EOMI. Oropharynx is clear. Right tympanic membrane is normal. Left tympanic membrane is erythematous with slight bulging. No perforation is seen. There is a small amount of drainage present in the external auditory canal, but  no erythema of the external auditory canal and no pain elicited when tension is applied to the helix. Neck is nontender and supple without adenopathy or JVD. Back is nontender and there is no CVA tenderness. Lungs are clear without rales, wheezes, or rhonchi. Chest is nontender. Heart has regular rate and rhythm without murmur. Abdomen is soft, flat, nontender without masses or hepatosplenomegaly and peristalsis is normoactive. Extremities have no cyanosis or edema, full range of motion is present. Skin is warm and dry without rash. Neurologic: Mental status is normal, cranial nerves are intact, there are no motor or sensory deficits.  ED  Treatments / Results   Procedures Procedures (including critical care time)  Medications Ordered in ED Medications  amoxicillin (AMOXIL) capsule 1,000 mg (not administered)     Initial Impression / Assessment and Plan / ED Course  I have reviewed the triage vital signs and the nursing notes.  Left otitis media. Old records are reviewed, and she has a prior ED visit for otitis externa of the left ear. She is discharged with prescriptions for amoxicillin and Cortisporin otic suspension. Picture is somewhat atypical, so she is referred to ENT for follow-up.  Final Clinical Impressions(s) / ED Diagnoses   Final diagnoses:  Acute suppurative otitis media of left ear without spontaneous rupture of tympanic membrane, recurrence not specified    New Prescriptions New Prescriptions   AMOXICILLIN (AMOXIL) 500 MG CAPSULE    Take 2 capsules (1,000 mg total) by mouth 2 (two) times daily.   NEOMYCIN-POLYMYXIN-HYDROCORTISONE (CORTISPORIN) 3.5-10000-1 OTIC SUSPENSION    Place 3 drops into the left ear 3 (three) times daily.     Dione Booze, MD 10/31/16 [phone]

## 2016-10-31 NOTE — Discharge Instructions (Signed)
Do not put anything in your ear except the prescribed ear drops. Follow up with the ENT physician.

## 2016-10-31 NOTE — ED Triage Notes (Signed)
Pt reports drainage from left ear x 2 weeks, denies pain at this time.

## 2016-11-28 ENCOUNTER — Ambulatory Visit (INDEPENDENT_AMBULATORY_CARE_PROVIDER_SITE_OTHER): Payer: 59 | Admitting: Women's Health

## 2016-11-28 ENCOUNTER — Encounter: Payer: Self-pay | Admitting: Women's Health

## 2016-11-28 VITALS — BP 132/80 | HR 86 | Ht 64.0 in | Wt 178.5 lb

## 2016-11-28 DIAGNOSIS — Z3202 Encounter for pregnancy test, result negative: Secondary | ICD-10-CM | POA: Diagnosis not present

## 2016-11-28 DIAGNOSIS — Z3046 Encounter for surveillance of implantable subdermal contraceptive: Secondary | ICD-10-CM

## 2016-11-28 DIAGNOSIS — F17200 Nicotine dependence, unspecified, uncomplicated: Secondary | ICD-10-CM

## 2016-11-28 LAB — POCT URINE PREGNANCY: PREG TEST UR: NEGATIVE

## 2016-11-28 MED ORDER — NORETHIN-ETH ESTRAD-FE BIPHAS 1 MG-10 MCG / 10 MCG PO TABS: 1.0000 | ORAL_TABLET | Freq: Every day | ORAL | 3 refills | Status: DC

## 2016-11-28 NOTE — Progress Notes (Signed)
   NEXPLANON REMOVAL Patient name: Tina Sanchez MRN [id number]  Date of birth: November 26, 1990 Subjective Findings:   Tina Sanchez is a 26 y.o. 591P1001 African American female being seen today for removal of a Nexplanon. Her Nexplanon was placed 12/01/14.  She desires removal because of bleeding 3wks out of the month- hasn't tried megace, doesn't want to try. Wants to switch to pills. No h/o HTN, DVT/PE, CVA, MI, or migraines w/ aura. Does smoke 'some'. Signed copy of informed consent in chart.   Patient's last menstrual period was 11/25/2016. Last pap 04/25/14 . Results were:  normal The planned method of family planning is OCP (estrogen/progesterone) Pertinent History Reviewed:   Reviewed past medical,surgical, social, obstetrical and family history.  Reviewed problem list, medications and allergies. Objective Findings & Procedure:    Vitals:   11/28/16 1441  BP: 132/80  Pulse: 86  Weight: 178 lb 8 oz (81 kg)  Height: 5\' 4"  (1.626 m)  Body mass index is 30.64 kg/m.  Results for orders placed or performed in visit on 11/28/16 (from the past 24 hour(s))  POCT urine pregnancy   Collection Time: 11/28/16  2:45 PM  Result Value Ref Range   Preg Test, Ur Negative Negative     Time out was performed.  Nexplanon site identified.  Area prepped in usual sterile fashon. One cc of 2% lidocaine was used to anesthetize the area at the distal end of the implant. A small stab incision was made right beside the implant on the distal portion.  The Nexplanon rod was grasped using hemostats and removed without difficulty.  There was less than 3 cc blood loss. There were no complications.  Steri-strips were applied over the small incision and a pressure bandage was applied.  The patient tolerated the procedure well. Assessment & Plan:   1) Nexplanon removal She was instructed to keep the area clean and dry, remove pressure bandage in 24 hours, and keep insertion site covered with the steri-strip for  3-5 days.    2) Contraception management> Rx LoLoestrin 3pk w/ 3RF, condoms always for STI prevention  3) Smoker> advised cessation, discussed increased r/f HTN, MI, CVA, DVT/PE w/ smoking/estrogen  Follow-up PRN problems.  Orders Placed This Encounter  Procedures  . POCT urine pregnancy    Follow-up: Return in about 3 months (around 02/28/2017) for F/U.  Marge DuncansBooker, Dean Goldner Randall CNM, [facility]NP[facility]BC 11/28/2016 3:28 PM

## 2017-02-28 ENCOUNTER — Encounter: Payer: Self-pay | Admitting: Women's Health

## 2017-02-28 ENCOUNTER — Ambulatory Visit (INDEPENDENT_AMBULATORY_CARE_PROVIDER_SITE_OTHER): Payer: 59 | Admitting: Women's Health

## 2017-02-28 VITALS — BP 120/80 | HR 79 | Ht 64.0 in | Wt 177.4 lb

## 2017-02-28 DIAGNOSIS — Z3041 Encounter for surveillance of contraceptive pills: Secondary | ICD-10-CM

## 2017-02-28 NOTE — Progress Notes (Signed)
   GYN VISIT Patient name: Tina Sanchez MRN [id number]  Date of birth: [birth year] Chief Complaint:   Follow-up (Loloestrin fe)  History of Present Illness:   Tina Sanchez is a 27 y.o. 891P1001 African American female being seen today for f/u on COCs. Rx'd LoLoestrin 11/28/16. Doing great, no problems. States periods are trying to get back on schedule.      Patient's last menstrual period was 01/23/2017. The current method of family planning is OCP (estrogen/progesterone). Last pap 04/25/14. Results were:  normal Review of Systems:   Pertinent items are noted in HPI Denies fever/chills, dizziness, headaches, visual disturbances, fatigue, shortness of breath, chest pain, abdominal pain, vomiting, abnormal vaginal discharge/itching/odor/irritation, problems with periods, bowel movements, urination, or intercourse unless otherwise stated above.  Pertinent History Reviewed:  Reviewed past medical,surgical, social, obstetrical and family history.  Reviewed problem list, medications and allergies. Physical Assessment:   Vitals:   02/28/17 0859  BP: 120/80  Pulse: 79  Weight: 177 lb 6.4 oz (80.5 kg)  Height: 5\' 4"  (1.626 m)  Body mass index is 30.45 kg/m.       Physical Examination:   General appearance: alert, well appearing, and in no distress  Mental status: alert, oriented to person, place, and time  Skin: warm & dry   Cardiovascular: normal heart rate noted  Respiratory: normal respiratory effort, no distress  Abdomen: soft, non-tender   Pelvic: examination not indicated  Extremities: no edema   No results found for this or any previous visit (from the past 24 hour(s)).  Assessment & Plan:  1) Contraception management> continue LoLoestrin  Meds: No orders of the defined types were placed in this encounter.   No orders of the defined types were placed in this encounter.   Return in about 2 months (around 04/28/2017) for Pap & physical.  Cheral MarkerKimberly R Telly Jawad CNM,  [facility] 02/28/2017 9:20 AM

## 2017-04-28 ENCOUNTER — Other Ambulatory Visit: Payer: 59 | Admitting: Women's Health

## 2017-07-26 ENCOUNTER — Encounter (HOSPITAL_COMMUNITY): Payer: Self-pay

## 2017-07-26 ENCOUNTER — Emergency Department (HOSPITAL_COMMUNITY)
Admission: EM | Admit: 2017-07-26 | Discharge: 2017-07-26 | Disposition: A | Discharge status: Home / Self Care | Payer: 59 | Attending: Emergency Medicine | Admitting: Emergency Medicine

## 2017-07-26 ENCOUNTER — Other Ambulatory Visit: Payer: Self-pay

## 2017-07-26 DIAGNOSIS — H9202 Otalgia, left ear: Secondary | ICD-10-CM | POA: Diagnosis present

## 2017-07-26 DIAGNOSIS — H60392 Other infective otitis externa, left ear: Secondary | ICD-10-CM

## 2017-07-26 DIAGNOSIS — Z87891 Personal history of nicotine dependence: Secondary | ICD-10-CM | POA: Diagnosis not present

## 2017-07-26 MED ORDER — CIPROFLOXACIN-HYDROCORTISONE 0.2-1 % OT SUSP: 3.0000 [drp] | Freq: Two times a day (BID) | OTIC | 0 refills | Status: DC

## 2017-07-26 MED ORDER — ANTIPYRINE-BENZOCAINE 5.4-1.4 % OT SOLN: 3.0000 [drp] | OTIC | 0 refills | Status: DC | PRN

## 2017-07-26 NOTE — ED Triage Notes (Signed)
Pt c/o left earache x 3 days.  Reports noticed brownish drainage.

## 2017-07-26 NOTE — ED Notes (Signed)
Pt reports ear drainage, "for months"  Has been here previously for same but did not follow up due to no pain  Here today for continued drainage as well as ear pain

## 2017-07-26 NOTE — Discharge Instructions (Signed)
Apply the medicine as instructed to your left ear for the next 10 days. Make sure you are lying with this ear up to allow maximum absorption into the ear canal.  You may also use the auralgan if needed for pain relief, but apply this after you have allowed the antibiotic to absorb.

## 2017-07-28 NOTE — ED Provider Notes (Addendum)
[facility] Provider Note   CSN: [id number] Arrival date & time: 07/26/17  1221     History   Chief Complaint Chief Complaint  Patient presents with  . Otalgia    HPI Tina Sanchez is a 27 y.o. female presenting with a return of sharp pain in her left ear in association with brown colored drainage from this ear.  she reports treated here last fall for similar symptoms, states the pain resolved but she has persistent daily brown drainage from the ear since her last infection.  She was referral to ENT previously stating she has had frequent chronic problems with this ear but has never followed up. She denies decreased hearing acuity, dizziness, tinnitus, fevers, chills, nasal congestion, sore throat or problems with frequent headaches. She has had no medicine prior to arrival and has found no alleviators.  The history is provided by the patient.    Past Medical History:  Diagnosis Date  . HSV-2 (herpes simplex virus 2) infection   . Medical history non-contributory     Patient Active Problem List   Diagnosis Date Noted  . Smoker 11/28/2016  . Nexplanon insertion 12/01/2014  . HSV-2 (herpes simplex virus 2) infection   . Trichomonal vaginitis during pregnancy in second trimester 04/27/2014  . Chlamydia infection affecting pregnancy in second trimester, antepartum 04/27/2014  . Susceptible to varicella (non-immune), currently pregnant 04/26/2014  . Marijuana use 04/26/2014    Past Surgical History:  Procedure Laterality Date  . NO PAST SURGERIES       OB History    Gravida  1   Para  1   Term  1   Preterm  0   AB  0   Living  1     SAB  0   TAB  0   Ectopic  0   Multiple  0   Live Births  1            Home Medications    Prior to Admission medications   Medication Sig Start Date End Date Taking? Authorizing Provider  antipyrine-benzocaine Lyla Son(AURALGAN) OTIC solution Place 3-4 drops into the right ear every 2 (two) hours as  needed for ear pain. Compounded solution of 1% hydrocortisone and 2% benzocaine compounded solution. 07/26/17   Burgess AmorIdol, Shuna Tabor, PA-C  ciprofloxacin-hydrocortisone (CIPRO HC) OTIC suspension Place 3 drops into the left ear 2 (two) times daily. 07/26/17   Burgess AmorIdol, Jeylin Woodmansee, PA-C  Norethindrone-Ethinyl Estradiol-Fe Biphas (LO LOESTRIN FE) 1 MG-10 MCG / 10 MCG tablet Take 1 tablet daily by mouth. 11/28/16   Cheral MarkerBooker, Kimberly R, CNM    Family History Family History  Problem Relation Age of Onset  . Hypertension Maternal Grandmother   . Cancer Maternal Grandfather        brain  . Diabetes Paternal Grandfather   . Cancer Paternal Uncle        prostate    Social History Social History   Tobacco Use  . Smoking status: Former Smoker    Types: Cigarettes  . Smokeless tobacco: Never Used  Substance Use Topics  . Alcohol use: Yes    Comment: occasionally  . Drug use: No    Types: Marijuana    Comment: Patient had positive UDS in July 2016 negative since      Allergies   Patient has no known allergies.   Review of Systems Review of Systems  Constitutional: Negative for chills and fever.  HENT: Positive for ear discharge and ear pain. Negative for  congestion, hearing loss, rhinorrhea, sinus pressure, sore throat, tinnitus, trouble swallowing and voice change.   Eyes: Negative for discharge.  Respiratory: Negative for cough, shortness of breath, wheezing and stridor.   Cardiovascular: Negative for chest pain.  Gastrointestinal: Negative for abdominal pain.  Genitourinary: Negative.   Neurological: Negative for dizziness.     Physical Exam Updated Vital Signs BP 140/77 (BP Location: Left Arm)   Pulse (!) 101   Temp 98.7 F (37.1 C) (Oral)   Resp 16   Ht 5\' 4"  (1.626 m)   Wt 76.2 kg (168 lb)   LMP 07/14/2017   SpO2 100%   BMI 28.84 kg/m   Physical Exam  Constitutional: She is oriented to person, place, and time. She appears well-developed and well-nourished.  HENT:  Head:  Normocephalic and atraumatic.  Right Ear: Tympanic membrane and ear canal normal. No drainage, swelling or tenderness. No mastoid tenderness. Tympanic membrane is not injected, not scarred and not erythematous. No middle ear effusion. No decreased hearing is noted.  Left Ear: Tympanic membrane normal. There is drainage and swelling. No tenderness. No foreign bodies. No mastoid tenderness. Tympanic membrane is not injected, not scarred and not erythematous.  No middle ear effusion. No decreased hearing is noted.  Nose: Mucosal edema and rhinorrhea present.  Mouth/Throat: Uvula is midline, oropharynx is clear and moist and mucous membranes are normal. No oropharyngeal exudate, posterior oropharyngeal edema, posterior oropharyngeal erythema or tonsillar abscesses.  Mild left ear canal edema and erythema with tan/brown liquid at the distal canal and entrance. TM is visible and is intact.   Eyes: Conjunctivae are normal.  Neck: Normal range of motion. Neck supple.  Cardiovascular: Normal rate and normal heart sounds.  Pulmonary/Chest: Effort normal. No respiratory distress. She has no wheezes. She has no rales.  Musculoskeletal: Normal range of motion.  Lymphadenopathy:    She has no cervical adenopathy.  Neurological: She is alert and oriented to person, place, and time.  Skin: Skin is warm and dry. No rash noted.  Psychiatric: She has a normal mood and affect.     ED Treatments / Results  Labs (all labs ordered are listed, but only abnormal results are displayed) Labs Reviewed - No data to display  EKG None  Radiology No results found.  Procedures Procedures (including critical care time)  Medications Ordered in ED Medications - No data to display   Initial Impression / Assessment and Plan / ED Course  I have reviewed the triage vital signs and the nursing notes.  Pertinent labs & imaging results that were available during my care of the patient were reviewed by me and considered  in my medical decision making (see chart for details).     Otitis externa with chronicity of ear discharge per patient report. She was placed on cipro HC otic suspension, auralgan for pain relief. No findings to suggest malignant otitis or abscess. Referral to ENT for f/u care.   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  

## 2019-02-03 ENCOUNTER — Telehealth: Payer: Self-pay | Admitting: Obstetrics & Gynecology

## 2019-02-03 NOTE — Telephone Encounter (Signed)
Tried to reach the patient to remind her of her appointment/restrictions, not able to leave a message. °

## 2019-02-04 ENCOUNTER — Other Ambulatory Visit (HOSPITAL_COMMUNITY)
Admission: RE | Admit: 2019-02-04 | Discharge: 2019-02-04 | Disposition: A | Discharge status: Home / Self Care | Payer: Self-pay | Source: Ambulatory Visit | Attending: Obstetrics & Gynecology | Admitting: Obstetrics & Gynecology

## 2019-02-04 ENCOUNTER — Other Ambulatory Visit: Payer: Self-pay

## 2019-02-04 DIAGNOSIS — Z113 Encounter for screening for infections with a predominantly sexual mode of transmission: Secondary | ICD-10-CM | POA: Insufficient documentation

## 2019-02-04 NOTE — Progress Notes (Signed)
   NURSE VISIT-SUBJECTIVE:  Tina Sanchez is a 29 y.o. G1P1001 GYN patientfemale here for a vaginal swab for STD  There were no vitals taken for this visit.  Appears well, in no apparent distress  ASSESSMENT: Vaginal swab for STD screen  PLAN: Self-collected vaginal probe for  sent to lab Treatment: to be determined once results are received Follow-up as needed if symptoms persist/worsen, or new symptoms develop  Rennis Petty  02/04/2019 3:54 PM

## 2019-02-08 LAB — CERVICOVAGINAL ANCILLARY ONLY
Chlamydia: POSITIVE — AB
Comment: NEGATIVE
Comment: NORMAL
Neisseria Gonorrhea: NEGATIVE

## 2019-02-09 ENCOUNTER — Telehealth: Payer: Self-pay | Admitting: Obstetrics & Gynecology

## 2019-02-09 ENCOUNTER — Telehealth: Payer: Self-pay | Admitting: *Deleted

## 2019-02-09 MED ORDER — AZITHROMYCIN 500 MG PO TABS: 1000.0000 mg | ORAL_TABLET | Freq: Once | ORAL | 1 refills | Status: AC

## 2019-02-09 NOTE — Telephone Encounter (Signed)
Attempted to call pt in regards to lab result. Unable to leave VM.

## 2019-02-09 NOTE — Telephone Encounter (Signed)
VM full

## 2019-02-11 ENCOUNTER — Telehealth: Payer: Self-pay | Admitting: *Deleted

## 2019-02-11 NOTE — Telephone Encounter (Signed)
LMOVM for patient to return my call regarding lab.

## 2019-02-12 ENCOUNTER — Telehealth: Payer: Self-pay | Admitting: *Deleted

## 2019-02-12 NOTE — Telephone Encounter (Signed)
Patient informed she has tested positive for chlamydia.  States she saw result on mychart and pickup and took medication on 1/26. Partner has not yet been treated.  Informed refill was available to him and needed to be treated. No sex for 7 days.  POC visit scheduled in 4 weeks. Pt verbalized understanding with no further questions.

## 2019-03-09 ENCOUNTER — Other Ambulatory Visit: Payer: Self-pay

## 2020-03-31 ENCOUNTER — Telehealth: Payer: Self-pay

## 2020-03-31 NOTE — Telephone Encounter (Signed)
Pt is needing a referral to [facility]([phone]) to be seen and she doesn't have a PCP they told her that we could sent a referral to them for the pt.

## 2020-05-09 ENCOUNTER — Other Ambulatory Visit (HOSPITAL_COMMUNITY)
Admission: RE | Admit: 2020-05-09 | Discharge: 2020-05-09 | Disposition: A | Discharge status: Home / Self Care | Payer: Medicaid Other | Source: Ambulatory Visit | Attending: Obstetrics & Gynecology | Admitting: Obstetrics & Gynecology

## 2020-05-09 ENCOUNTER — Other Ambulatory Visit: Payer: Self-pay

## 2020-05-09 ENCOUNTER — Ambulatory Visit (INDEPENDENT_AMBULATORY_CARE_PROVIDER_SITE_OTHER): Payer: Medicaid Other | Admitting: Women's Health

## 2020-05-09 ENCOUNTER — Encounter: Payer: Self-pay | Admitting: Women's Health

## 2020-05-09 VITALS — BP 120/85 | HR 94 | Ht 63.0 in | Wt 187.5 lb

## 2020-05-09 DIAGNOSIS — Z0000 Encounter for general adult medical examination without abnormal findings: Secondary | ICD-10-CM | POA: Diagnosis present

## 2020-05-09 DIAGNOSIS — Z800 Family history of malignant neoplasm of digestive organs: Secondary | ICD-10-CM

## 2020-05-09 DIAGNOSIS — Z3202 Encounter for pregnancy test, result negative: Secondary | ICD-10-CM

## 2020-05-09 DIAGNOSIS — H9212 Otorrhea, left ear: Secondary | ICD-10-CM

## 2020-05-09 DIAGNOSIS — Z30015 Encounter for initial prescription of vaginal ring hormonal contraceptive: Secondary | ICD-10-CM

## 2020-05-09 LAB — POCT URINE PREGNANCY: Preg Test, Ur: NEGATIVE

## 2020-05-09 MED ORDER — ANNOVERA 0.013-0.15 MG/24HR VA RING: VAGINAL_RING | VAGINAL | 0 refills | Status: DC

## 2020-05-09 NOTE — Progress Notes (Signed)
WELL-WOMAN EXAMINATION Patient name: Tina Sanchez MRN [id number]  Date of birth: [birth year] Chief Complaint:   Gynecologic Exam (Wants to start birth control pills; requests referral to ENT for ear drainage; irregular periods)  History of Present Illness:   Tina Sanchez is a 30 y.o. G59P1001 African American female being seen today for a routine well-woman exam.  Current complaints: Lt ear problems since she was a baby. Can't hear well out of it. Random prolonged periods of liquidy malodorous wax, recently saw blood from that ear. No pain. Wants referral to ENT.  Regular periods, have become lighter on first few days. Wants birth control. Discussed all options. Wants Annovera.  Does not smoke, no h/o HTN, DVT/PE, CVA, MI, or migraines w/ aura.   Depression screen [facility] 2/9 05/09/2020  Decreased Interest 1  Down, Depressed, Hopeless 1  PHQ - 2 Score 2  Altered sleeping 0  Tired, decreased energy 1  Change in appetite 0  Feeling bad or failure about yourself  0  Trouble concentrating 1  Moving slowly or fidgety/restless 0  Suicidal thoughts 0  PHQ-9 Score 4     PCP: none      does not desire labs Patient's last menstrual period was 04/29/2020 (approximate). The current method of family planning is none.  Last pap 04/25/14. Results were: NILM w/ HRHPV not done. H/O abnormal pap: no Last mammogram: never. Results were: N/A. Family h/o breast cancer: no Last colonoscopy: never. Results were: N/A. Family h/o colorectal cancer: yes brother dx @ 32yo, PU dx in 59s Review of Systems:   Pertinent items are noted in HPI Denies any headaches, blurred vision, fatigue, shortness of breath, chest pain, abdominal pain, abnormal vaginal discharge/itching/odor/irritation, problems with periods, bowel movements, urination, or intercourse unless otherwise stated above. Pertinent History Reviewed:  Reviewed past medical,surgical, social and family history.  Reviewed problem list, medications and  allergies. Physical Assessment:   Vitals:   05/09/20 0940  BP: 120/85  Pulse: 94  Weight: 187 lb 8 oz (85 kg)  Height: 5\' 3"  (1.6 m)  Body mass index is 33.21 kg/m.        Physical Examination:   General appearance - well appearing, and in no distress  Mental status - alert, oriented to person, place, and time  Psych:  She has a normal mood and affect  Skin - warm and dry, normal color, no suspicious lesions noted  Ears: Rt TM normal, Lt TM normal w/ hole in soft tissue above TM- not sure if this is Etube opening? Pt states she feels drainage start in this area  Chest - effort normal, all lung fields clear to auscultation bilaterally  Heart - normal rate and regular rhythm  Neck:  midline trachea, no thyromegaly or nodules  Breasts - breasts appear normal, no suspicious masses, no skin or nipple changes or  axillary nodes  Abdomen - soft, nontender, nondistended, no masses or organomegaly  Pelvic - VULVA: normal appearing vulva with no masses, tenderness or lesions  VAGINA: normal appearing vagina with normal color and discharge, no lesions  CERVIX: normal appearing cervix without discharge or lesions, no CMT  Thin prep pap is done w/ HR HPV cotesting  UTERUS: uterus is felt to be normal size, shape, consistency and nontender   ADNEXA: No adnexal masses or tenderness noted.  Extremities:  No swelling or varicosities noted  Chaperone:    Results for orders placed or performed in visit on 05/09/20 (from the past 24 hour(s))  POCT urine pregnancy   Collection Time: 05/09/20  9:43 AM  Result Value Ref Range   Preg Test, Ur Negative Negative    Assessment & Plan:  1) Well-Woman Exam  2) Malodorous ear drainage w/ blood Lt ear> w/ reported hearing loss, referral to ENT  3) Family hx colon cancer> brother 32yo, PU 60yo, offered [facility] hereditary cancer screening, will let us know if she decides she wants. Referral to GI ordered to begin screening TCS  4)  Contraception management> rx Annovera, discussed use. F/U   Labs/procedures today: pap  Mammogram: @ 28yo, or sooner if problems Colonoscopy: now d/t family hx  Orders Placed This Encounter  Procedures  . Ambulatory referral to ENT  . Ambulatory referral to Gastroenterology  . POCT urine pregnancy    Meds:  Meds ordered this encounter  Medications  . Segesterone-Ethinyl Estradiol (ANNOVERA) 0.15-0.013 MG/24HR RING    Sig: Insert ring into vagina, leave in for 3 weeks, then take out for 1 week to have period. Rinse ring, store in case. Repeat monthly.    Dispense:  1 each    Refill:  0    Order Specific Question:   Supervising Provider    Answer:   Lazaro Arms [[id number]]    Follow-up: Return in about 3 months (around 08/08/2020) for med f/u, in person, CNM.  Cheral Marker CNM, [facility] 05/09/2020 [phone]

## 2020-05-15 ENCOUNTER — Other Ambulatory Visit: Payer: Self-pay | Admitting: Women's Health

## 2020-05-15 ENCOUNTER — Encounter: Payer: Self-pay | Admitting: Internal Medicine

## 2020-05-15 ENCOUNTER — Encounter: Payer: Self-pay | Admitting: Women's Health

## 2020-05-15 DIAGNOSIS — R87619 Unspecified abnormal cytological findings in specimens from cervix uteri: Secondary | ICD-10-CM | POA: Insufficient documentation

## 2020-05-15 LAB — CYTOLOGY - PAP
Chlamydia: NEGATIVE
Comment: NEGATIVE
Comment: NEGATIVE
Comment: NEGATIVE
Comment: NORMAL
HPV 16: NEGATIVE
HPV 18 / 45: POSITIVE — AB
High risk HPV: POSITIVE — AB
Neisseria Gonorrhea: NEGATIVE

## 2020-05-15 MED ORDER — ETONOGESTREL-ETHINYL ESTRADIOL 0.12-0.015 MG/24HR VA RING: VAGINAL_RING | VAGINAL | 3 refills | Status: DC

## 2020-06-14 ENCOUNTER — Ambulatory Visit: Payer: Medicaid Other

## 2020-08-08 ENCOUNTER — Ambulatory Visit: Payer: Medicaid Other | Admitting: Adult Health

## 2021-08-01 ENCOUNTER — Other Ambulatory Visit: Payer: Self-pay | Admitting: Student

## 2021-08-01 DIAGNOSIS — H902 Conductive hearing loss, unspecified: Secondary | ICD-10-CM

## 2021-11-01 ENCOUNTER — Ambulatory Visit
Admission: EM | Admit: 2021-11-01 | Discharge: 2021-11-01 | Disposition: A | Discharge status: Home / Self Care | Payer: Medicaid Other | Attending: Nurse Practitioner | Admitting: Nurse Practitioner

## 2021-11-01 DIAGNOSIS — J069 Acute upper respiratory infection, unspecified: Secondary | ICD-10-CM | POA: Insufficient documentation

## 2021-11-01 DIAGNOSIS — Z1152 Encounter for screening for COVID-19: Secondary | ICD-10-CM | POA: Insufficient documentation

## 2021-11-01 LAB — RESP PANEL BY RT-PCR (FLU A&B, COVID) ARPGX2
Influenza A by PCR: NEGATIVE
Influenza B by PCR: NEGATIVE
SARS Coronavirus 2 by RT PCR: POSITIVE — AB

## 2021-11-01 MED ORDER — FLUTICASONE PROPIONATE 50 MCG/ACT NA SUSP: 1.0000 | Freq: Every day | NASAL | 0 refills | Status: DC

## 2021-11-01 NOTE — ED Triage Notes (Signed)
Pt reports sinus pressure, body aches and chills x 1 day.  States she do not feel sick. Pt has not taken any meds for complaints.  Pt reports she has a lot of drainage in her ear that be the cause of th sinus pressure. Report she is being seeing by ENT.

## 2021-11-01 NOTE — Discharge Instructions (Addendum)
You have a viral upper respiratory infection.  This should improve over the next week or so.  We have tested you today for COVID-19 and influenza.  You will see the results in Mychart and we will call you with positive results.    Please stay home and isolate until you are aware of the results.    Some things that can make you feel better are: - Increased rest - Increasing fluid with water/sugar free electrolytes - Acetaminophen and ibuprofen as needed for fever/pain  - Salt water gargling, chloraseptic spray and throat lozenges - OTC guaifenesin (Mucinex) 600 mg twice daily  - Saline sinus flushes or a neti pot - Humidifying the air - Flonase nasal spray - OTC decongestant (do not use for more than 3 days)

## 2021-11-01 NOTE — ED Provider Notes (Signed)
[facility]    CSN: [id number] Arrival date & time: 11/01/21  1536      History   Chief Complaint Chief Complaint  Patient presents with   Chills   Generalized Body Aches        Nasal Congestion         HPI Tina Sanchez is a 31 y.o. female.   Patient presents for 1 day of body aches, chills, dry cough, nasal congestion, runny nose, postnasal drainage, sinus pressure, headache, drainage from the left ear that is clear, and nausea without vomiting.  She also endorses it feels "thick" to swallow, but no sore throat.  She denies fevers, shortness of breath or chest pain, sneezing, ear pain, abdominal pain, vomiting, diarrhea, decreased appetite, new rash, and fatigue.  Has not take anything for symptoms so far.  Reports she is following with an ear nose and throat doctor for the issues with the left ear including drainage and perforated tympanic membrane.    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 Authorizing Provider  fluticasone (FLONASE) 50 MCG/ACT nasal spray Place 1 spray into both nostrils daily. 11/01/21  Yes Eulogio Bear, NP  etonogestrel-ethinyl estradiol (NUVARING) 0.12-0.015 MG/24HR vaginal ring Insert  vaginally and leave in place for 3 consecutive weeks, then remove for 1 week. 05/15/20   Roma Schanz, CNM    Family History Family History  Problem Relation Age of Onset   Hypertension Maternal Grandmother    Cancer Maternal Grandfather        brain   Diabetes Paternal Grandfather    Cancer Paternal Uncle        prostate    Social History Social History   Tobacco Use   Smoking status: Former    Types: Cigarettes   Smokeless tobacco: Never  Vaping Use   Vaping Use: Never used  Substance Use Topics   Alcohol use: Yes    Comment: occasionally   Drug use: No    Types: Marijuana    Comment: Patient had positive UDS in July 2016 negative since      Allergies   Patient has no known allergies.   Review of Systems Review of Systems Per HPI  Physical Exam Triage Vital Signs ED Triage Vitals  Enc Vitals Group     BP 11/01/21 1541 (!) 144/93     Pulse Rate 11/01/21 1541 (!) 113     Resp 11/01/21  1541 17     Temp 11/01/21 1541 98 F (36.7 C)     Temp Source 11/01/21 1541 Oral     SpO2 11/01/21 1541 95 %     Weight --      Height --      Head Circumference --      Peak Flow --      Pain Score 11/01/21 1542 0     Pain Loc --      Pain Edu? --      Excl. in GC? --    No data found.  Updated Vital Signs BP (!) 144/93 (BP Location: Right Arm)   Pulse (!) 113   Temp 98 F (36.7 C) (Oral)   Resp 17   LMP  (Within Weeks) Comment: 2 weeks  SpO2 95%   Visual Acuity Right Eye Distance:   Left Eye Distance:   Bilateral Distance:    Right Eye Near:   Left Eye Near:    Bilateral Near:     Physical Exam Vitals and nursing note reviewed.  Constitutional:      General: She is not in acute distress.    Appearance: Normal appearance. She is not ill-appearing or toxic-appearing.  HENT:     Head: Normocephalic and atraumatic.     Right Ear: Ear canal and external ear normal. A middle ear effusion is present.     Left Ear: Ear canal and external ear normal. A  middle ear effusion is present. Tympanic membrane is perforated. Tympanic membrane is not erythematous.     Nose: Congestion and rhinorrhea present.     Right Turbinates: Enlarged and swollen.     Left Turbinates: Enlarged and swollen.     Right Sinus: No maxillary sinus tenderness or frontal sinus tenderness.     Left Sinus: No maxillary sinus tenderness or frontal sinus tenderness.     Mouth/Throat:     Mouth: Mucous membranes are moist.     Pharynx: Oropharynx is clear. No oropharyngeal exudate or posterior oropharyngeal erythema.     Tonsils: No tonsillar exudate. 0 on the right. 0 on the left.  Eyes:     General: No scleral icterus.    Extraocular Movements: Extraocular movements intact.  Cardiovascular:     Rate and Rhythm: Normal rate and regular rhythm.  Pulmonary:     Effort: Pulmonary effort is normal. No respiratory distress.     Breath sounds: Normal breath sounds. No wheezing, rhonchi or rales.  Abdominal:     General: Abdomen is flat. Bowel sounds are normal. There is no distension.     Palpations: Abdomen is soft.     Tenderness: There is no abdominal tenderness. There is no guarding.  Musculoskeletal:     Cervical back: Normal range of motion and neck supple.  Lymphadenopathy:     Cervical: No cervical adenopathy.  Skin:    General: Skin is warm and dry.     Coloration: Skin is not jaundiced or pale.     Findings: No erythema or rash.  Neurological:     Mental Status: She is alert and oriented to person, place, and time.  Psychiatric:        Behavior: Behavior is cooperative.      UC Treatments / Results  Labs (all labs ordered are listed, but only abnormal results are displayed) Labs Reviewed  RESP PANEL BY RT-PCR (FLU A&B, COVID) [id number]    EKG   Radiology No results found.  Procedures Procedures (including critical  care time)  Medications Ordered in UC Medications - No data to display  Initial Impression / Assessment and Plan / UC Course  I  have reviewed the triage vital signs and the nursing notes.  Pertinent labs & imaging results that were available during my care of the patient were reviewed by me and considered in my medical decision making (see chart for details).   Patient is well-appearing, normotensive, afebrile, not tachypneic, oxygenating well on room air.  She is mildly tachycardic today, likely secondary to acute illness.  Viral URI with cough Encounter for screening for COVID-19 COVID-19, influenza testing obtained Supportive care discussed Start Flonase Also recommended specifically guaifenesin and Sudafed for symptoms ER and return precautions discussed Note given for work  The patient was given the opportunity to ask questions.  All questions answered to their satisfaction.  The patient is in agreement to this plan.    Final Clinical Impressions(s) / UC Diagnoses   Final diagnoses:  Viral URI with cough  Encounter for screening for COVID-19     Discharge Instructions      You have a viral upper respiratory infection.  This should improve over the next week or so.  We have tested you today for COVID-19 and influenza.  You will see the results in Mychart and we will call you with positive results.    Please stay home and isolate until you are aware of the results.    Some things that can make you feel better are: - Increased rest - Increasing fluid with water/sugar free electrolytes - Acetaminophen and ibuprofen as needed for fever/pain  - Salt water gargling, chloraseptic spray and throat lozenges - OTC guaifenesin (Mucinex) 600 mg twice daily  - Saline sinus flushes or a neti pot - Humidifying the air - Flonase nasal spray - OTC decongestant (do not use for more than 3 days)     ED Prescriptions     Medication Sig Dispense Auth. Provider   fluticasone (FLONASE) 50 MCG/ACT nasal spray Place 1 spray into both nostrils daily. 16 g Valentino Nose, NP      PDMP not reviewed this  encounter.   Valentino Nose, NP 11/01/21 [phone]

## 2022-07-09 ENCOUNTER — Ambulatory Visit: Payer: 59 | Admitting: Adult Health

## 2023-07-11 ENCOUNTER — Encounter: Payer: Self-pay | Admitting: Family Medicine

## 2023-07-11 ENCOUNTER — Ambulatory Visit (INDEPENDENT_AMBULATORY_CARE_PROVIDER_SITE_OTHER): Admitting: Family Medicine

## 2023-07-11 VITALS — BP 114/80 | HR 96 | Wt 180.0 lb

## 2023-07-11 DIAGNOSIS — R7301 Impaired fasting glucose: Secondary | ICD-10-CM | POA: Diagnosis not present

## 2023-07-11 DIAGNOSIS — G8929 Other chronic pain: Secondary | ICD-10-CM | POA: Insufficient documentation

## 2023-07-11 DIAGNOSIS — E038 Other specified hypothyroidism: Secondary | ICD-10-CM | POA: Diagnosis not present

## 2023-07-11 DIAGNOSIS — Z1159 Encounter for screening for other viral diseases: Secondary | ICD-10-CM

## 2023-07-11 DIAGNOSIS — D509 Iron deficiency anemia, unspecified: Secondary | ICD-10-CM

## 2023-07-11 DIAGNOSIS — E782 Mixed hyperlipidemia: Secondary | ICD-10-CM

## 2023-07-11 DIAGNOSIS — Z0001 Encounter for general adult medical examination with abnormal findings: Secondary | ICD-10-CM | POA: Insufficient documentation

## 2023-07-11 DIAGNOSIS — H9212 Otorrhea, left ear: Secondary | ICD-10-CM

## 2023-07-11 DIAGNOSIS — Z7689 Persons encountering health services in other specified circumstances: Secondary | ICD-10-CM

## 2023-07-11 DIAGNOSIS — H9202 Otalgia, left ear: Secondary | ICD-10-CM

## 2023-07-11 DIAGNOSIS — E559 Vitamin D deficiency, unspecified: Secondary | ICD-10-CM

## 2023-07-11 NOTE — Assessment & Plan Note (Signed)
 The patient reports chronic purulent drainage from the left ear, which is intermittent. Ear pain and a muffled sensation in the left ear also come and go. The patient states they were evaluated by ENT a few years ago, but did not follow up. On today's examination, the tympanic membranes appear normal bilaterally, with no active drainage noted.

## 2023-07-11 NOTE — Progress Notes (Signed)
 Complete physical exam  Patient: Tina Sanchez   DOB: [birth year]   33 y.o. Female  MRN: [id number]  Subjective:    Chief Complaint  Patient presents with   Establish Care   PCOS    Would like to discuss possible PCOS and other hormonal concerns, some symptoms are hair follicles on chin area, mood fluctuations, fatigue , fertility questions, irregular menses     Tina Sanchez is a 33 y.o. female who presents today for a complete physical exam. She reports consuming a general diet. The patient does not participate in regular exercise at present. She generally feels okay. She reports sleeping fair 6 hours per nigh. She does have additional problems to discuss today.    Most recent fall risk assessment:    05/09/2020    9:37 AM  Fall Risk   Falls in the past year? 0     Most recent depression screenings:    05/09/2020    9:33 AM  PHQ 2/9 Scores  PHQ - 2 Score 2  PHQ- 9 Score 4    Vision:Within last year and Dental: No current dental problems and Last dental visit: 2023  Patient Care Team: Del Wilhelmena Lloyd Sola, FNP as PCP - General (Family Medicine) Shaaron Lamar HERO, MD as Consulting Physician (Gastroenterology)   Outpatient Medications Prior to Visit  Medication Sig   etonogestrel -ethinyl estradiol  (NUVARING) 0.12-0.015 MG/24HR vaginal ring Insert vaginally and leave in place for 3 consecutive weeks, then remove for 1 week. (Patient not taking: Reported on 07/11/2023)   fluticasone  (FLONASE ) 50 MCG/ACT nasal spray Place 1 spray into both nostrils daily. (Patient not taking: Reported on 07/11/2023)   No facility-administered medications prior to visit.    Review of Systems  Constitutional:  Negative for chills and fever.  HENT:  Negative for ear pain.   Eyes:  Negative for blurred vision.  Respiratory:  Negative for shortness of breath.   Cardiovascular:  Negative for chest pain.  Gastrointestinal:  Negative for abdominal pain and blood in stool.   Genitourinary:  Negative for dysuria.  Musculoskeletal:  Negative for myalgias.  Skin:  Negative for rash.  Neurological:  Negative for dizziness.  Psychiatric/Behavioral:  The patient does not have insomnia.        Objective:    BP 114/80 (BP Location: Left Arm, Patient Position: Sitting, Cuff Size: Large)   Pulse 96   Wt 180 lb (81.6 kg)   SpO2 97%   BMI 31.89 kg/m  BP Readings from Last 3 Encounters:  07/11/23 114/80  11/01/21 (!) 144/93  05/09/20 120/85     Physical Exam Vitals reviewed.  Constitutional:      General: She is not in acute distress.    Appearance: Normal appearance. She is not ill-appearing, toxic-appearing or diaphoretic.  HENT:     Head: Normocephalic.     Right Ear: Tympanic membrane normal.     Left Ear: Tympanic membrane normal.     Mouth/Throat:     Mouth: Mucous membranes are moist.   Eyes:     General:        Right eye: No discharge.        Left eye: No discharge.     Conjunctiva/sclera: Conjunctivae normal.     Pupils: Pupils are equal, round, and reactive to light.    Cardiovascular:     Rate and Rhythm: Normal rate.     Pulses: Normal pulses.     Heart sounds: Normal heart sounds.  Pulmonary:  Effort: Pulmonary effort is normal. No respiratory distress.     Breath sounds: Normal breath sounds.  Abdominal:     General: Bowel sounds are normal.     Palpations: Abdomen is soft.     Tenderness: There is no abdominal tenderness. There is no right CVA tenderness, left CVA tenderness or guarding.   Skin:    General: Skin is warm and dry.     Capillary Refill: Capillary refill takes less than 2 seconds.   Neurological:     Mental Status: She is alert.     Coordination: Coordination normal.     Gait: Gait normal.   Psychiatric:        Mood and Affect: Mood normal.        Behavior: Behavior normal.      No results found for any visits on 07/11/23.    Assessment & Plan:    Routine Health Maintenance and Physical  Exam   There is no immunization history on file for this patient.  Health Maintenance  Topic Date Due   Hepatitis C Screening  Never done   DTaP/Tdap/Td (1 - Tdap) Never done   Pneumococcal Vaccine 75-11 Years old (1 of 2 - PCV) Never done   Hepatitis B Vaccines (1 of 3 - 19+ 3-dose series) Never done   HPV VACCINES (1 - 3-dose SCDM series) Never done   COVID-19 Vaccine (1 - 2024-25 season) Never done   Cervical Cancer Screening (HPV/Pap Cotest)  05/10/2023   INFLUENZA VACCINE  08/15/2023   HIV Screening  Completed   Meningococcal B Vaccine  Aged Out    Discussed health benefits of physical activity, and encouraged her to engage in regular exercise appropriate for her age and condition.  Referral of patient -     Ambulatory referral to Obstetrics / Gynecology  Need for hepatitis C screening test -     Hepatitis C antibody  Mixed hyperlipidemia -     Lipid panel -     CMP14+EGFR -     CBC with Differential/Platelet  TSH (thyroid-stimulating hormone deficiency) -     TSH + free T4  IFG (impaired fasting glucose) -     Hemoglobin A1c  Vitamin D deficiency -     VITAMIN D 25 Hydroxy (Vit-D Deficiency, Fractures)  Iron deficiency anemia, unspecified iron deficiency anemia type -     Vitamin B12 -     Iron, TIBC and Ferritin Panel  Drainage from left ear -     Ambulatory referral to ENT  Chronic ear pain, left Assessment & Plan: The patient reports chronic purulent drainage from the left ear, which is intermittent. Ear pain and a muffled sensation in the left ear also come and go. The patient states they were evaluated by ENT a few years ago, but did not follow up. On today's examination, the tympanic membranes appear normal bilaterally, with no active drainage noted.   Encounter for routine adult physical exam with abnormal findings Assessment & Plan: A comprehensive physical examination was completed, and necessary labs were ordered. Screening and health maintenance  recommendations have been updated. The patient received counseling on exercise and nutrition. BMI was assessed and discussed Advise for heart health, focus on: Eat more fruits and vegetables: Aim for a variety of colors. Choose whole grains: Brown rice, oats, and whole-wheat bread. Limit unhealthy fats: Avoid trans fats; use olive or avocado oil instead. Include lean proteins: Opt for fish, chicken, beans, and legumes. Reduce sodium: Limit processed foods  and add less salt. Stay hydrated: Drink plenty of water. Exercise regularly: Aim for at least 30 minutes of moderate exercise, like walking or cycling, 5 days a week.       Return in about 1 year (around 07/10/2024), or if symptoms worsen or fail to improve.     Hilario Kidd Wilhelmena Falter, FNP

## 2023-07-11 NOTE — Assessment & Plan Note (Signed)

## 2023-07-11 NOTE — Patient Instructions (Signed)

## 2023-07-12 LAB — CMP14+EGFR
ALT: 11 IU/L (ref 0–32)
AST: 17 IU/L (ref 0–40)
Albumin: 4.2 g/dL (ref 3.9–4.9)
Alkaline Phosphatase: 56 IU/L (ref 44–121)
BUN/Creatinine Ratio: 16 (ref 9–23)
BUN: 13 mg/dL (ref 6–20)
Bilirubin Total: 0.4 mg/dL (ref 0.0–1.2)
CO2: 21 mmol/L (ref 20–29)
Calcium: 9.4 mg/dL (ref 8.7–10.2)
Chloride: 101 mmol/L (ref 96–106)
Creatinine, Ser: 0.8 mg/dL (ref 0.57–1.00)
Globulin, Total: 2.7 g/dL (ref 1.5–4.5)
Glucose: 90 mg/dL (ref 70–99)
Potassium: 4.5 mmol/L (ref 3.5–5.2)
Sodium: 137 mmol/L (ref 134–144)
Total Protein: 6.9 g/dL (ref 6.0–8.5)
eGFR: 101 mL/min/{1.73_m2} (ref >59)

## 2023-07-12 LAB — CBC WITH DIFFERENTIAL/PLATELET
Basophils Absolute: 0 10*3/uL (ref 0.0–0.2)
Basos: 0 %
EOS (ABSOLUTE): 0.1 10*3/uL (ref 0.0–0.4)
Eos: 2 %
Hematocrit: 40.9 % (ref 34.0–46.6)
Hemoglobin: 12.9 g/dL (ref 11.1–15.9)
Immature Grans (Abs): 0 10*3/uL (ref 0.0–0.1)
Immature Granulocytes: 0 %
Lymphocytes Absolute: 2 10*3/uL (ref 0.7–3.1)
Lymphs: 43 %
MCH: 26.5 pg — ABNORMAL LOW (ref 26.6–33.0)
MCHC: 31.5 g/dL (ref 31.5–35.7)
MCV: 84 fL (ref 79–97)
Monocytes Absolute: 0.3 10*3/uL (ref 0.1–0.9)
Monocytes: 6 %
Neutrophils Absolute: 2.4 10*3/uL (ref 1.4–7.0)
Neutrophils: 49 %
Platelets: 314 10*3/uL (ref 150–450)
RBC: 4.87 x10E6/uL (ref 3.77–5.28)
RDW: 12.4 % (ref 11.7–15.4)
WBC: 4.8 10*3/uL (ref 3.4–10.8)

## 2023-07-12 LAB — IRON,TIBC AND FERRITIN PANEL
Ferritin: 59 ng/mL (ref 15–150)
Iron Saturation: 37 % (ref 15–55)
Iron: 110 ug/dL (ref 27–159)
Total Iron Binding Capacity: 297 ug/dL (ref 250–450)
UIBC: 187 ug/dL (ref 131–425)

## 2023-07-12 LAB — VITAMIN D 25 HYDROXY (VIT D DEFICIENCY, FRACTURES): Vit D, 25-Hydroxy: 23.4 ng/mL — ABNORMAL LOW (ref 30.0–100.0)

## 2023-07-12 LAB — HEMOGLOBIN A1C
Est. average glucose Bld gHb Est-mCnc: 108 mg/dL
Hgb A1c MFr Bld: 5.4 % (ref 4.8–5.6)

## 2023-07-12 LAB — LIPID PANEL
Chol/HDL Ratio: 3.8 ratio (ref 0.0–4.4)
Cholesterol, Total: 161 mg/dL (ref 100–199)
HDL: 42 mg/dL (ref >39)
LDL Chol Calc (NIH): 110 mg/dL — ABNORMAL HIGH (ref 0–99)
Triglycerides: 41 mg/dL (ref 0–149)
VLDL Cholesterol Cal: 9 mg/dL (ref 5–40)

## 2023-07-12 LAB — VITAMIN B12: Vitamin B-12: 377 pg/mL (ref 232–1245)

## 2023-07-12 LAB — HEPATITIS C ANTIBODY: Hep C Virus Ab: NONREACTIVE

## 2023-07-12 LAB — TSH+FREE T4
Free T4: 1.13 ng/dL (ref 0.82–1.77)
TSH: 0.93 u[IU]/mL (ref 0.450–4.500)

## 2023-07-15 ENCOUNTER — Ambulatory Visit: Payer: Self-pay | Admitting: Family Medicine

## 2023-08-21 ENCOUNTER — Ambulatory Visit (INDEPENDENT_AMBULATORY_CARE_PROVIDER_SITE_OTHER): Admitting: Physician Assistant

## 2023-08-21 VITALS — BP 129/83 | HR 92

## 2023-08-21 DIAGNOSIS — H9192 Unspecified hearing loss, left ear: Secondary | ICD-10-CM | POA: Diagnosis not present

## 2023-08-21 DIAGNOSIS — H9212 Otorrhea, left ear: Secondary | ICD-10-CM | POA: Diagnosis not present

## 2023-08-21 NOTE — Progress Notes (Signed)
 Dear Dr. Terry Wilhelmena Falter, Here is my assessment for our mutual patient, Tina Sanchez. Thank you for allowing me the opportunity to care for your patient. Please do not hesitate to contact me should you have any other questions. Sincerely, Tina Cohen PA-C  Otolaryngology Clinic Note Referring provider: Dr. Terry Wilhelmena Falter HPI:  Tina Sanchez is a 33 y.o. female kindly referred by Dr. Terry Wilhelmena Falter   The patient is a 33 year old female seen in the office for evaluation of ear drainage.  She notes that she first noted drainage out of her left ear in 2018.  She notes that this had changed in consistency over time, originally she had some foul odor to it, with time that has gone away.  She notes it is slightly discolored but mostly clear.  She notes decreased hearing on the left when compared to right.  Several years ago she sought care at [facility] where they noted drainage from the ear, reportedly they ordered an MRI after reviewing her audiological results.  She did not get this MRI.  Since that time she notes continued daily drainage out of the left ear, no significant pain, no fever.  Occasional ringing.  She notes persistent hearing loss out of the left ear that may be worsening but she is uncertain.  She denies any trauma to the left ear, she denies any surgery to the ear.  She did have recurrent ear infections as a child but has only had 1-2 episodes of otitis media over her adulthood.  She denies any significant chronic medical conditions.  She does not use Q-tips, no significant water exposure.  She notes routine headaches that are nonsevere.   Independent Review of Additional Tests or Records:  none   PMH/Meds/All/SocHx/FamHx/ROS:   Past Medical History:  Diagnosis Date   HSV-2 (herpes simplex virus 2) infection    Medical history non-contributory      Past Surgical History:  Procedure Laterality Date   NO PAST SURGERIES      Family History  Problem Relation Age  of Onset   Hypertension Maternal Grandmother    Cancer Maternal Grandfather        brain   Diabetes Paternal Grandfather    Cancer Paternal Uncle        prostate     Social Connections: Moderately Isolated (07/11/2023)   Social Connection and Isolation Panel    Frequency of Communication with Friends and Family: Twice a week    Frequency of Social Gatherings with Friends and Family: Once a week    Attends Religious Services: More than 4 times per year    Active Member of [employer] or Organizations: No    Attends Banker Meetings: Never    Marital Status: Never married      Current Outpatient Medications:    etonogestrel -ethinyl estradiol  (NUVARING) 0.12-0.015 MG/24HR vaginal ring, Insert vaginally and leave in place for 3 consecutive weeks, then remove for 1 week. (Patient not taking: Reported on 07/11/2023), Disp: 3 each, Rfl: 3   fluticasone  (FLONASE ) 50 MCG/ACT nasal spray, Place 1 spray into both nostrils daily. (Patient not taking: Reported on 07/11/2023), Disp: 16 g, Rfl: 0   Physical Exam:   BP 129/83   Pulse 92   SpO2 98%   Pertinent Findings  CN II-XII intact Right external auditory canal clear TM intact with well-pneumatized middle ear space, left EAC with yellowish clear fluid, left TM grossly intact with an approximate 10% area of defects along the posterior  middle TM with thin layer of TM intact with obvious yellowish fluid within the middle ear space, no obvious source of drainage Weber 512: equal Rinne 512: AC > BC b/l  Anterior rhinoscopy: Septum midline; bilateral inferior turbinates with no hypertrophy No lesions of oral cavity/oropharynx; dentition within normal limits No obviously palpable neck masses/lymphadenopathy/thyromegaly No respiratory distress or stridor  Seprately Identifiable Procedures:  None  Impression & Plans:  Alandra Sando is a 33 y.o. female with the following   Left ear drainage-  33 year old female with an approximate  6-year history of left ear drainage.  She does have hearing loss on the left.  She reports seeing ENT previously who reportedly ordered an MR of her ear.  I am unable to review these results presently.  On exam she does have drainage in the left ear this does not appear to be infected.  She does have thinning of the left TM and although no apparent source of the drainage I suspect she is getting seepage from this area.  Given the hearing loss and the obvious findings on physical exam I would like to review her audiological results as this will help guide my imaging.  The patient will have that information faxed to our office.  Imaging will be ordered, she will also need an updated audiogram, but given the delay in obtaining audiogram presently we will base my recommendation off of her previous.  Once the images are available would like to see her back in the office for review of them and further management.  The patient verbalized understanding and agreement to today's plan had no further questions or concerns.   - f/u office visit with audiological results and imaging   Thank you for allowing me the opportunity to care for your patient. Please do not hesitate to contact me should you have any other questions.  Sincerely, Tina Cohen PA-C [facility] Phone: [phone] Fax: [phone]  08/21/2023, 1:09 PM

## 2023-08-28 ENCOUNTER — Telehealth (INDEPENDENT_AMBULATORY_CARE_PROVIDER_SITE_OTHER): Payer: Self-pay | Admitting: Physician Assistant

## 2023-08-28 ENCOUNTER — Telehealth (INDEPENDENT_AMBULATORY_CARE_PROVIDER_SITE_OTHER): Payer: Self-pay

## 2023-08-28 NOTE — Telephone Encounter (Signed)
 Patient called stating that she could not get her records from her old ENT. Please advise.

## 2023-08-28 NOTE — Telephone Encounter (Signed)
 Pt returned call

## 2023-08-28 NOTE — Telephone Encounter (Signed)
Thanks I spoke with her

## 2023-09-01 ENCOUNTER — Encounter (INDEPENDENT_AMBULATORY_CARE_PROVIDER_SITE_OTHER): Payer: Self-pay

## 2023-09-01 ENCOUNTER — Other Ambulatory Visit (INDEPENDENT_AMBULATORY_CARE_PROVIDER_SITE_OTHER): Payer: Self-pay | Admitting: Physician Assistant

## 2023-09-01 DIAGNOSIS — H90A11 Conductive hearing loss, unilateral, right ear with restricted hearing on the contralateral side: Secondary | ICD-10-CM

## 2023-09-01 NOTE — Progress Notes (Signed)
CT temporal bone ordered.

## 2023-09-10 ENCOUNTER — Encounter: Admitting: Advanced Practice Midwife

## 2023-11-06 ENCOUNTER — Encounter (INDEPENDENT_AMBULATORY_CARE_PROVIDER_SITE_OTHER): Payer: Self-pay

## 2023-11-28 ENCOUNTER — Emergency Department (HOSPITAL_COMMUNITY)

## 2023-11-28 ENCOUNTER — Encounter (HOSPITAL_COMMUNITY): Payer: Self-pay

## 2023-11-28 ENCOUNTER — Other Ambulatory Visit: Payer: Self-pay

## 2023-11-28 ENCOUNTER — Emergency Department (HOSPITAL_COMMUNITY)
Admission: EM | Admit: 2023-11-28 | Discharge: 2023-11-28 | Disposition: A | Discharge status: Home / Self Care | Attending: Emergency Medicine | Admitting: Emergency Medicine

## 2023-11-28 DIAGNOSIS — S20212A Contusion of left front wall of thorax, initial encounter: Secondary | ICD-10-CM | POA: Diagnosis not present

## 2023-11-28 DIAGNOSIS — S50811A Abrasion of right forearm, initial encounter: Secondary | ICD-10-CM | POA: Insufficient documentation

## 2023-11-28 DIAGNOSIS — S40812A Abrasion of left upper arm, initial encounter: Secondary | ICD-10-CM | POA: Diagnosis not present

## 2023-11-28 DIAGNOSIS — W19XXXA Unspecified fall, initial encounter: Secondary | ICD-10-CM

## 2023-11-28 DIAGNOSIS — Z23 Encounter for immunization: Secondary | ICD-10-CM | POA: Diagnosis not present

## 2023-11-28 DIAGNOSIS — S50311A Abrasion of right elbow, initial encounter: Secondary | ICD-10-CM | POA: Diagnosis not present

## 2023-11-28 DIAGNOSIS — S3991XA Unspecified injury of abdomen, initial encounter: Secondary | ICD-10-CM | POA: Insufficient documentation

## 2023-11-28 DIAGNOSIS — S30810A Abrasion of lower back and pelvis, initial encounter: Secondary | ICD-10-CM | POA: Insufficient documentation

## 2023-11-28 DIAGNOSIS — S299XXA Unspecified injury of thorax, initial encounter: Secondary | ICD-10-CM | POA: Diagnosis present

## 2023-11-28 DIAGNOSIS — T07XXXA Unspecified multiple injuries, initial encounter: Secondary | ICD-10-CM

## 2023-11-28 LAB — HCG, SERUM, QUALITATIVE: Preg, Serum: NEGATIVE

## 2023-11-28 LAB — COMPREHENSIVE METABOLIC PANEL WITH GFR
ALT: 24 U/L (ref 0–44)
AST: 25 U/L (ref 15–41)
Albumin: 4.7 g/dL (ref 3.5–5.0)
Alkaline Phosphatase: 58 U/L (ref 38–126)
Anion gap: 11 (ref 5–15)
BUN: 11 mg/dL (ref 6–20)
CO2: 24 mmol/L (ref 22–32)
Calcium: 9.2 mg/dL (ref 8.9–10.3)
Chloride: 103 mmol/L (ref 98–111)
Creatinine, Ser: 0.79 mg/dL (ref 0.44–1.00)
GFR, Estimated: >60 mL/min (ref >60)
Glucose, Bld: 97 mg/dL (ref 70–99)
Potassium: 4 mmol/L (ref 3.5–5.1)
Sodium: 138 mmol/L (ref 135–145)
Total Bilirubin: 0.3 mg/dL (ref 0.0–1.2)
Total Protein: 7.7 g/dL (ref 6.5–8.1)

## 2023-11-28 LAB — CBC WITH DIFFERENTIAL/PLATELET
Abs Immature Granulocytes: 0.02 K/uL (ref 0.00–0.07)
Basophils Absolute: 0 K/uL (ref 0.0–0.1)
Basophils Relative: 0 %
Eosinophils Absolute: 0 K/uL (ref 0.0–0.5)
Eosinophils Relative: 0 %
HCT: 39.5 % (ref 36.0–46.0)
Hemoglobin: 13.1 g/dL (ref 12.0–15.0)
Immature Granulocytes: 0 %
Lymphocytes Relative: 19 %
Lymphs Abs: 1.6 K/uL (ref 0.7–4.0)
MCH: 27.2 pg (ref 26.0–34.0)
MCHC: 33.2 g/dL (ref 30.0–36.0)
MCV: 82 fL (ref 80.0–100.0)
Monocytes Absolute: 0.5 K/uL (ref 0.1–1.0)
Monocytes Relative: 6 %
Neutro Abs: 6.2 K/uL (ref 1.7–7.7)
Neutrophils Relative %: 75 %
Platelets: 324 K/uL (ref 150–400)
RBC: 4.82 MIL/uL (ref 3.87–5.11)
RDW: 12.8 % (ref 11.5–15.5)
WBC: 8.3 K/uL (ref 4.0–10.5)
nRBC: 0 % (ref 0.0–0.2)

## 2023-11-28 LAB — I-STAT CREATININE, ED: Creatinine, Ser: 0.9 mg/dL (ref 0.44–1.00)

## 2023-11-28 LAB — LIPASE, BLOOD: Lipase: 15 U/L (ref 11–51)

## 2023-11-28 LAB — LACTIC ACID, PLASMA: Lactic Acid, Venous: 1.4 mmol/L (ref 0.5–1.9)

## 2023-11-28 MED ORDER — MORPHINE SULFATE (PF) 4 MG/ML IV SOLN
4.0000 mg | Freq: Once | INTRAVENOUS | Status: AC
  Administered 2023-11-28: 4 mg via INTRAVENOUS
  Filled 2023-11-28: qty 1

## 2023-11-28 MED ORDER — BACITRACIN ZINC 500 UNIT/GM EX OINT
TOPICAL_OINTMENT | Freq: Once | CUTANEOUS | Status: AC
  Administered 2023-11-28: 1 via TOPICAL
  Filled 2023-11-28: qty 3.6

## 2023-11-28 MED ORDER — TETANUS-DIPHTH-ACELL PERTUSSIS 5-2-15.5 LF-MCG/0.5 IM SUSP
0.5000 mL | Freq: Once | INTRAMUSCULAR | Status: AC
  Administered 2023-11-28: 0.5 mL via INTRAMUSCULAR
  Filled 2023-11-28: qty 0.5

## 2023-11-28 MED ORDER — IOHEXOL 300 MG/ML  SOLN
100.0000 mL | Freq: Once | INTRAMUSCULAR | Status: AC | PRN
  Administered 2023-11-28: 100 mL via INTRAVENOUS

## 2023-11-28 MED ORDER — OXYCODONE-ACETAMINOPHEN 5-325 MG PO TABS: 1.0000 | ORAL_TABLET | ORAL | 0 refills | Status: AC | PRN
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
## 2023-11-28 NOTE — Discharge Instructions (Addendum)
 Apply ice to sore areas.  Ice should be applied for 30 minutes at a time, 4 times a day.  Take ibuprofen  or naproxen as needed for pain.  To get additional pain relief, add acetaminophen .  When you combine acetaminophen  and ibuprofen , you get better pain relief that you get from taking other medication by itself.

## 2023-11-28 NOTE — ED Notes (Signed)
 ED Provider at bedside.
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
## 2023-12-01 MED FILL — Oxycodone w/ Acetaminophen Tab 5-325 MG: ORAL | Qty: 6 | Status: AC
